# Patient Record
Sex: Male | Born: 1935 | Race: White | Hispanic: No | Smoking: Former smoker
Health system: Southern US, Community
[De-identification: ages and names within clinical notes are randomized; demographics above are authoritative.]

## PROBLEM LIST (undated history)

## (undated) DIAGNOSIS — R17 Unspecified jaundice: Secondary | ICD-10-CM

## (undated) DIAGNOSIS — I219 Acute myocardial infarction, unspecified: Secondary | ICD-10-CM

## (undated) DIAGNOSIS — R059 Cough, unspecified: Secondary | ICD-10-CM

## (undated) DIAGNOSIS — R0989 Other specified symptoms and signs involving the circulatory and respiratory systems: Secondary | ICD-10-CM

## (undated) DIAGNOSIS — K635 Polyp of colon: Secondary | ICD-10-CM

## (undated) DIAGNOSIS — I2510 Atherosclerotic heart disease of native coronary artery without angina pectoris: Secondary | ICD-10-CM

## (undated) DIAGNOSIS — F32A Depression, unspecified: Secondary | ICD-10-CM

## (undated) DIAGNOSIS — I714 Abdominal aortic aneurysm, without rupture, unspecified: Secondary | ICD-10-CM

## (undated) DIAGNOSIS — F329 Major depressive disorder, single episode, unspecified: Secondary | ICD-10-CM

## (undated) DIAGNOSIS — E785 Hyperlipidemia, unspecified: Secondary | ICD-10-CM

## (undated) DIAGNOSIS — R05 Cough: Secondary | ICD-10-CM

## (undated) DIAGNOSIS — M79606 Pain in leg, unspecified: Secondary | ICD-10-CM

## (undated) DIAGNOSIS — I10 Essential (primary) hypertension: Secondary | ICD-10-CM

## (undated) HISTORY — DX: Cough, unspecified: R05.9

## (undated) HISTORY — DX: Pain in leg, unspecified: M79.606

## (undated) HISTORY — DX: Polyp of colon: K63.5

## (undated) HISTORY — DX: Unspecified jaundice: R17

## (undated) HISTORY — DX: Abdominal aortic aneurysm, without rupture, unspecified: I71.40

## (undated) HISTORY — DX: Abdominal aortic aneurysm, without rupture: I71.4

## (undated) HISTORY — DX: Acute myocardial infarction, unspecified: I21.9

## (undated) HISTORY — PX: OTHER SURGICAL HISTORY: SHX169

## (undated) HISTORY — DX: Essential (primary) hypertension: I10

## (undated) HISTORY — DX: Cough: R05

## (undated) HISTORY — DX: Atherosclerotic heart disease of native coronary artery without angina pectoris: I25.10

## (undated) HISTORY — DX: Depression, unspecified: F32.A

## (undated) HISTORY — DX: Other specified symptoms and signs involving the circulatory and respiratory systems: R09.89

## (undated) HISTORY — DX: Hyperlipidemia, unspecified: E78.5

## (undated) HISTORY — DX: Major depressive disorder, single episode, unspecified: F32.9

---

## 2002-05-18 ENCOUNTER — Encounter: Payer: Self-pay | Admitting: Gastroenterology

## 2002-08-27 ENCOUNTER — Inpatient Hospital Stay (HOSPITAL_COMMUNITY): Admission: EM | Admit: 2002-08-27 | Discharge: 2002-08-30 | Payer: Self-pay | Admitting: Emergency Medicine

## 2002-08-27 ENCOUNTER — Encounter: Payer: Self-pay | Admitting: *Deleted

## 2002-08-30 ENCOUNTER — Encounter: Payer: Self-pay | Admitting: Internal Medicine

## 2003-03-07 ENCOUNTER — Emergency Department (HOSPITAL_COMMUNITY): Admission: EM | Admit: 2003-03-07 | Discharge: 2003-03-07 | Payer: Self-pay | Admitting: Emergency Medicine

## 2003-03-07 ENCOUNTER — Encounter: Payer: Self-pay | Admitting: Emergency Medicine

## 2003-05-27 HISTORY — PX: CHOLECYSTECTOMY: SHX55

## 2003-08-01 ENCOUNTER — Encounter: Admission: RE | Admit: 2003-08-01 | Discharge: 2003-08-01 | Payer: Self-pay | Admitting: Cardiology

## 2003-10-04 ENCOUNTER — Ambulatory Visit (HOSPITAL_COMMUNITY): Admission: RE | Admit: 2003-10-04 | Discharge: 2003-10-04 | Payer: Self-pay | Admitting: Internal Medicine

## 2004-01-11 ENCOUNTER — Ambulatory Visit (HOSPITAL_COMMUNITY): Admission: RE | Admit: 2004-01-11 | Discharge: 2004-01-11 | Payer: Self-pay | Admitting: Family Medicine

## 2004-03-05 ENCOUNTER — Inpatient Hospital Stay (HOSPITAL_COMMUNITY): Admission: EM | Admit: 2004-03-05 | Discharge: 2004-03-16 | Payer: Self-pay | Admitting: Emergency Medicine

## 2004-03-09 ENCOUNTER — Encounter: Payer: Self-pay | Admitting: Gastroenterology

## 2004-03-12 ENCOUNTER — Encounter (INDEPENDENT_AMBULATORY_CARE_PROVIDER_SITE_OTHER): Payer: Self-pay | Admitting: Specialist

## 2004-03-28 ENCOUNTER — Ambulatory Visit: Payer: Self-pay | Admitting: Family Medicine

## 2004-04-03 ENCOUNTER — Ambulatory Visit: Payer: Self-pay | Admitting: Family Medicine

## 2004-04-10 ENCOUNTER — Ambulatory Visit: Payer: Self-pay | Admitting: Family Medicine

## 2004-04-16 ENCOUNTER — Ambulatory Visit: Payer: Self-pay | Admitting: Family Medicine

## 2004-04-17 ENCOUNTER — Ambulatory Visit: Payer: Self-pay | Admitting: Family Medicine

## 2004-04-24 ENCOUNTER — Ambulatory Visit: Payer: Self-pay | Admitting: Family Medicine

## 2004-05-01 ENCOUNTER — Ambulatory Visit: Payer: Self-pay | Admitting: Family Medicine

## 2004-05-08 ENCOUNTER — Ambulatory Visit: Payer: Self-pay | Admitting: Cardiology

## 2004-06-01 ENCOUNTER — Ambulatory Visit: Payer: Self-pay | Admitting: Cardiology

## 2004-06-01 ENCOUNTER — Inpatient Hospital Stay (HOSPITAL_COMMUNITY): Admission: EM | Admit: 2004-06-01 | Discharge: 2004-06-05 | Payer: Self-pay | Admitting: Emergency Medicine

## 2004-06-06 ENCOUNTER — Ambulatory Visit: Payer: Self-pay | Admitting: Family Medicine

## 2004-06-13 ENCOUNTER — Ambulatory Visit (HOSPITAL_COMMUNITY): Admission: RE | Admit: 2004-06-13 | Discharge: 2004-06-13 | Payer: Self-pay | Admitting: *Deleted

## 2004-07-02 ENCOUNTER — Ambulatory Visit: Payer: Self-pay | Admitting: Cardiology

## 2004-07-03 ENCOUNTER — Ambulatory Visit: Payer: Self-pay | Admitting: Family Medicine

## 2004-07-08 ENCOUNTER — Ambulatory Visit: Payer: Self-pay | Admitting: Family Medicine

## 2004-08-05 ENCOUNTER — Ambulatory Visit: Payer: Self-pay | Admitting: Family Medicine

## 2004-08-30 ENCOUNTER — Ambulatory Visit: Payer: Self-pay | Admitting: Family Medicine

## 2004-09-06 ENCOUNTER — Ambulatory Visit: Payer: Self-pay | Admitting: Family Medicine

## 2004-09-20 ENCOUNTER — Ambulatory Visit: Payer: Self-pay | Admitting: Family Medicine

## 2004-10-08 ENCOUNTER — Ambulatory Visit: Payer: Self-pay | Admitting: Family Medicine

## 2004-11-06 ENCOUNTER — Ambulatory Visit: Payer: Self-pay | Admitting: Family Medicine

## 2004-11-11 ENCOUNTER — Ambulatory Visit: Payer: Self-pay | Admitting: Cardiology

## 2004-11-14 ENCOUNTER — Ambulatory Visit: Payer: Self-pay | Admitting: Gastroenterology

## 2004-11-19 ENCOUNTER — Ambulatory Visit: Payer: Self-pay

## 2004-12-10 ENCOUNTER — Ambulatory Visit: Payer: Self-pay | Admitting: Gastroenterology

## 2004-12-10 ENCOUNTER — Ambulatory Visit: Payer: Self-pay | Admitting: Family Medicine

## 2005-02-10 ENCOUNTER — Ambulatory Visit: Payer: Self-pay | Admitting: Family Medicine

## 2005-02-14 ENCOUNTER — Ambulatory Visit: Payer: Self-pay | Admitting: Family Medicine

## 2005-02-25 ENCOUNTER — Ambulatory Visit: Payer: Self-pay | Admitting: Family Medicine

## 2005-03-10 ENCOUNTER — Ambulatory Visit: Payer: Self-pay | Admitting: Family Medicine

## 2005-03-14 ENCOUNTER — Ambulatory Visit: Payer: Self-pay | Admitting: Family Medicine

## 2005-04-10 ENCOUNTER — Ambulatory Visit: Payer: Self-pay | Admitting: Family Medicine

## 2005-05-12 ENCOUNTER — Ambulatory Visit: Payer: Self-pay | Admitting: Family Medicine

## 2005-05-22 ENCOUNTER — Ambulatory Visit: Payer: Self-pay | Admitting: Cardiology

## 2005-07-10 ENCOUNTER — Ambulatory Visit: Payer: Self-pay | Admitting: Family Medicine

## 2005-08-15 ENCOUNTER — Ambulatory Visit: Payer: Self-pay | Admitting: Family Medicine

## 2005-08-26 ENCOUNTER — Ambulatory Visit: Payer: Self-pay | Admitting: Family Medicine

## 2005-09-25 ENCOUNTER — Ambulatory Visit: Payer: Self-pay | Admitting: Family Medicine

## 2005-10-15 ENCOUNTER — Ambulatory Visit: Payer: Self-pay | Admitting: Family Medicine

## 2005-11-03 ENCOUNTER — Ambulatory Visit: Payer: Self-pay | Admitting: Family Medicine

## 2005-12-17 ENCOUNTER — Ambulatory Visit: Payer: Self-pay | Admitting: Family Medicine

## 2006-01-06 ENCOUNTER — Ambulatory Visit: Payer: Self-pay

## 2006-01-06 ENCOUNTER — Encounter: Payer: Self-pay | Admitting: Cardiology

## 2006-01-16 ENCOUNTER — Ambulatory Visit: Payer: Self-pay | Admitting: Cardiology

## 2006-01-21 IMAGING — CR DG CHEST 2V
2 series · 2 of 2 positions shown · non-contrast
Comparison: none

CLINICAL DATA: Shortness of breath. 
 CHEST, TWO VIEWS  
 PA and lateral views without comparison films reveal the heart size to be mildly enlarged with dilatation of the aorta.  The markings are accentuated in the lower lung fields with some loss of volume in the bases.  
 IMPRESSION
 The heart size is prominent with chronic changes in the bases.  No definite active findings.  No pulmonary edema is thought to be present.   There are areas of pleural scarring on the right.  Comparison with previous chest x-ray would be suggested.

[view not recorded (1 of 2)]
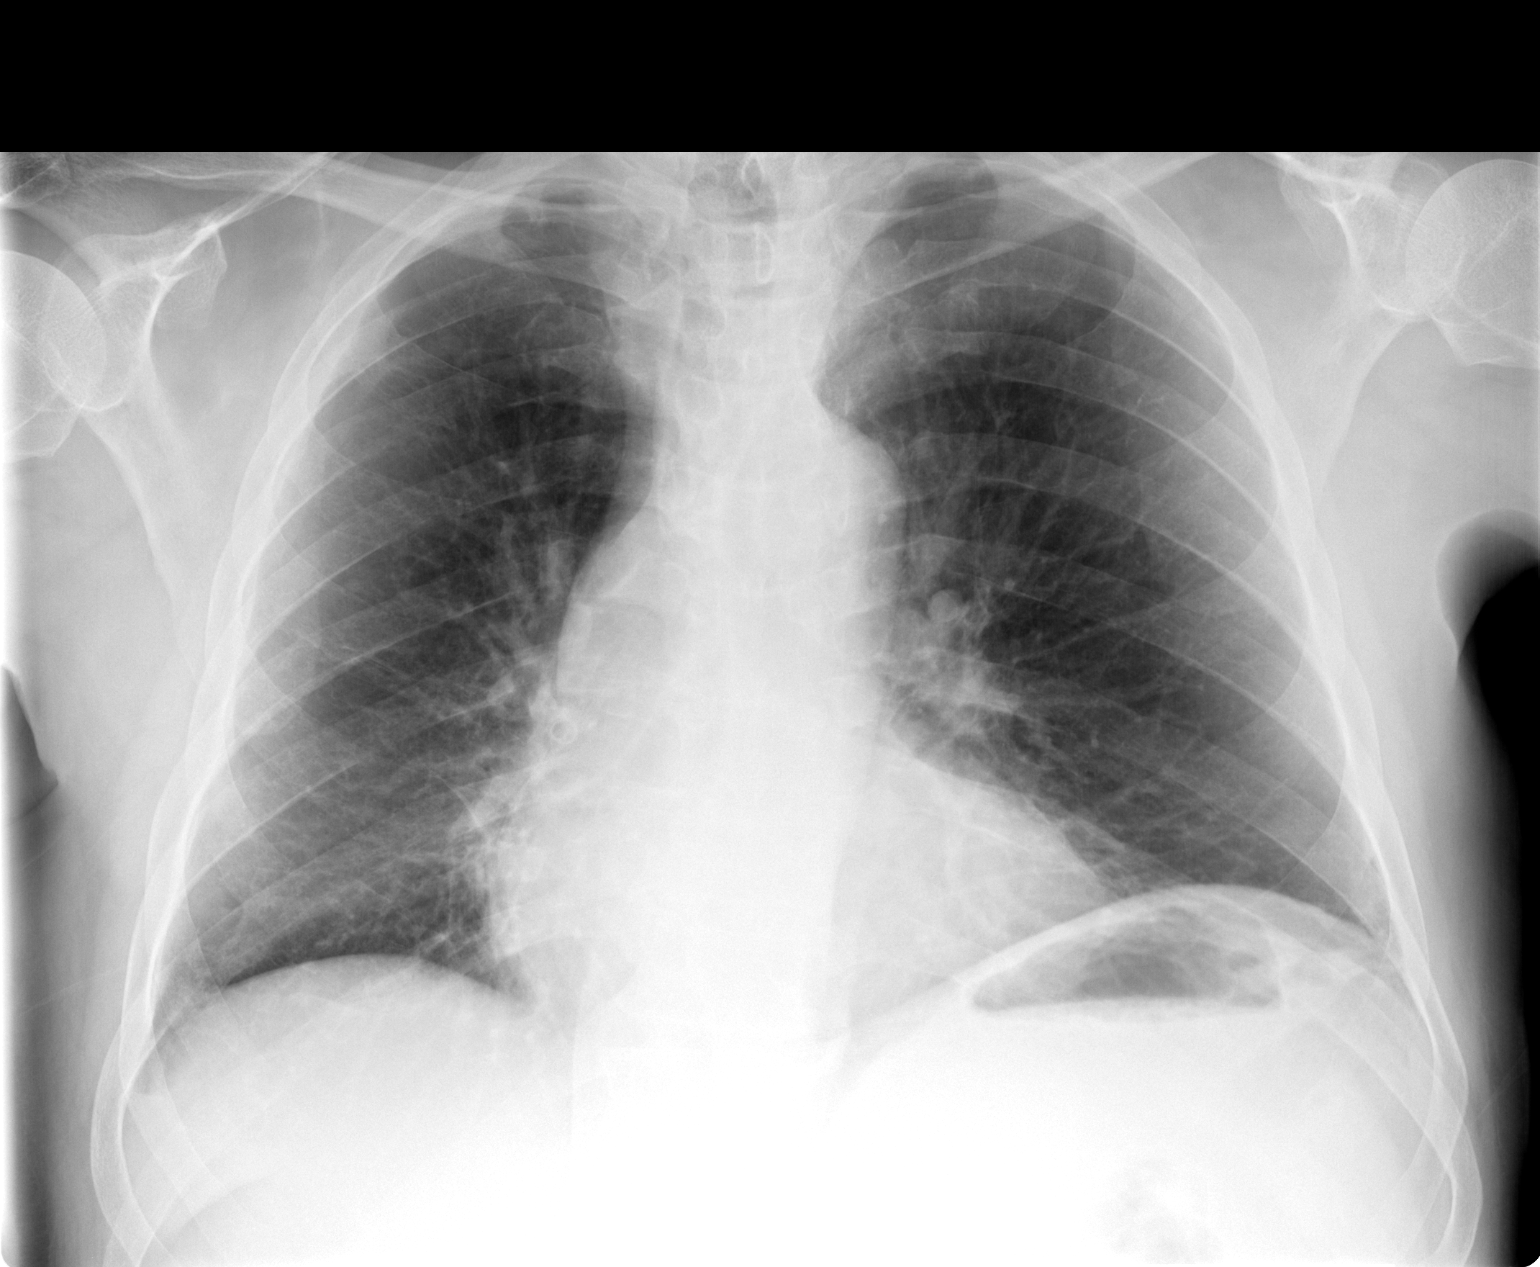

[view not recorded (2 of 2)]
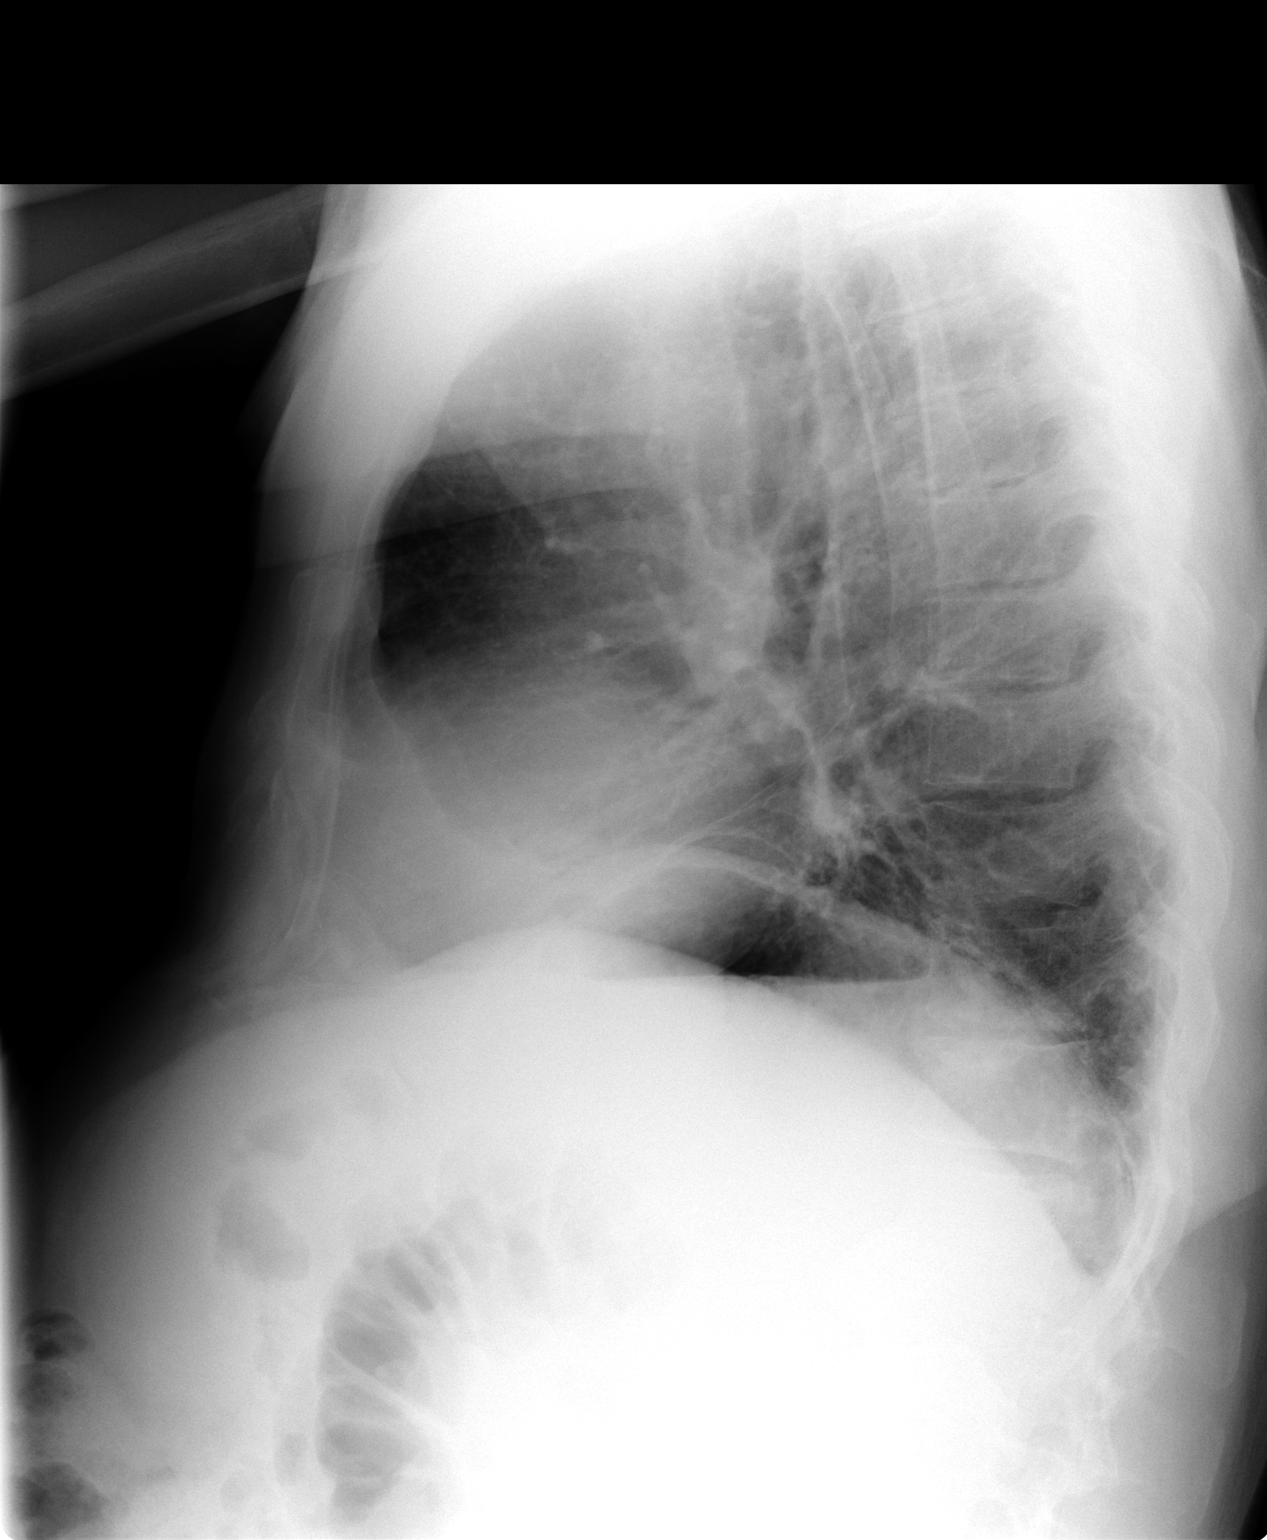

[2 of 2 positions shown; findings below may reference images not displayed]

## 2006-01-28 ENCOUNTER — Ambulatory Visit: Payer: Self-pay | Admitting: Family Medicine

## 2006-02-19 ENCOUNTER — Ambulatory Visit: Payer: Self-pay | Admitting: Family Medicine

## 2006-03-19 ENCOUNTER — Ambulatory Visit: Payer: Self-pay | Admitting: Family Medicine

## 2006-04-20 ENCOUNTER — Ambulatory Visit: Payer: Self-pay | Admitting: Family Medicine

## 2006-05-08 ENCOUNTER — Ambulatory Visit: Payer: Self-pay | Admitting: Cardiology

## 2006-05-20 ENCOUNTER — Ambulatory Visit: Payer: Self-pay | Admitting: Family Medicine

## 2006-06-23 ENCOUNTER — Ambulatory Visit: Payer: Self-pay | Admitting: Family Medicine

## 2006-07-23 ENCOUNTER — Ambulatory Visit: Payer: Self-pay | Admitting: *Deleted

## 2006-07-24 ENCOUNTER — Ambulatory Visit: Payer: Self-pay | Admitting: Family Medicine

## 2006-09-11 ENCOUNTER — Ambulatory Visit: Payer: Self-pay | Admitting: Family Medicine

## 2006-09-22 ENCOUNTER — Ambulatory Visit: Payer: Self-pay | Admitting: Family Medicine

## 2006-09-29 ENCOUNTER — Ambulatory Visit: Payer: Self-pay | Admitting: Cardiology

## 2006-10-21 ENCOUNTER — Ambulatory Visit: Payer: Self-pay | Admitting: Family Medicine

## 2006-11-09 ENCOUNTER — Ambulatory Visit: Payer: Self-pay | Admitting: Cardiology

## 2006-11-12 ENCOUNTER — Ambulatory Visit: Payer: Self-pay | Admitting: Cardiology

## 2006-11-13 ENCOUNTER — Inpatient Hospital Stay (HOSPITAL_COMMUNITY): Admission: EM | Admit: 2006-11-13 | Discharge: 2006-11-13 | Payer: Self-pay | Admitting: *Deleted

## 2006-11-14 ENCOUNTER — Emergency Department (HOSPITAL_COMMUNITY): Admission: EM | Admit: 2006-11-14 | Discharge: 2006-11-14 | Payer: Self-pay | Admitting: Emergency Medicine

## 2006-12-02 ENCOUNTER — Ambulatory Visit: Payer: Self-pay | Admitting: Cardiology

## 2007-01-21 ENCOUNTER — Ambulatory Visit: Payer: Self-pay | Admitting: *Deleted

## 2007-02-04 ENCOUNTER — Ambulatory Visit: Payer: Self-pay | Admitting: *Deleted

## 2007-02-04 ENCOUNTER — Encounter: Admission: RE | Admit: 2007-02-04 | Discharge: 2007-02-04 | Payer: Self-pay | Admitting: *Deleted

## 2007-03-22 ENCOUNTER — Ambulatory Visit: Payer: Self-pay | Admitting: *Deleted

## 2007-03-22 ENCOUNTER — Inpatient Hospital Stay (HOSPITAL_COMMUNITY): Admission: RE | Admit: 2007-03-22 | Discharge: 2007-03-24 | Payer: Self-pay | Admitting: *Deleted

## 2007-03-22 HISTORY — PX: ABDOMINAL AORTIC ANEURYSM REPAIR: SUR1152

## 2007-04-29 ENCOUNTER — Encounter: Admission: RE | Admit: 2007-04-29 | Discharge: 2007-04-29 | Payer: Self-pay | Admitting: *Deleted

## 2007-04-29 ENCOUNTER — Ambulatory Visit: Payer: Self-pay | Admitting: *Deleted

## 2007-05-17 ENCOUNTER — Ambulatory Visit: Payer: Self-pay

## 2007-05-27 DIAGNOSIS — I219 Acute myocardial infarction, unspecified: Secondary | ICD-10-CM

## 2007-05-27 HISTORY — DX: Acute myocardial infarction, unspecified: I21.9

## 2007-06-11 ENCOUNTER — Ambulatory Visit: Payer: Self-pay | Admitting: Cardiology

## 2007-11-11 ENCOUNTER — Ambulatory Visit: Payer: Self-pay | Admitting: *Deleted

## 2008-01-24 ENCOUNTER — Ambulatory Visit: Payer: Self-pay | Admitting: Cardiology

## 2008-05-11 ENCOUNTER — Ambulatory Visit: Payer: Self-pay | Admitting: *Deleted

## 2008-05-11 ENCOUNTER — Encounter: Admission: RE | Admit: 2008-05-11 | Discharge: 2008-05-11 | Payer: Self-pay | Admitting: *Deleted

## 2008-07-03 ENCOUNTER — Ambulatory Visit: Payer: Self-pay | Admitting: Cardiology

## 2008-07-03 LAB — CONVERTED CEMR LAB
ALT: 16 units/L (ref 0–53)
AST: 21 units/L (ref 0–37)
Albumin: 3.8 g/dL (ref 3.5–5.2)
HDL: 27 mg/dL — ABNORMAL LOW (ref >39.0)
Total Bilirubin: 2.4 mg/dL — ABNORMAL HIGH (ref 0.3–1.2)
Total Protein: 7.1 g/dL (ref 6.0–8.3)
Triglycerides: 139 mg/dL (ref 0–149)
VLDL: 28 mg/dL (ref 0–40)

## 2008-10-04 ENCOUNTER — Encounter: Payer: Self-pay | Admitting: Cardiology

## 2008-11-09 ENCOUNTER — Ambulatory Visit: Payer: Self-pay | Admitting: *Deleted

## 2009-01-30 ENCOUNTER — Ambulatory Visit: Payer: Self-pay | Admitting: Cardiology

## 2009-01-30 ENCOUNTER — Telehealth: Payer: Self-pay | Admitting: Cardiology

## 2009-02-01 ENCOUNTER — Encounter: Payer: Self-pay | Admitting: Cardiology

## 2009-04-02 ENCOUNTER — Telehealth: Payer: Self-pay | Admitting: Cardiology

## 2009-04-03 ENCOUNTER — Telehealth: Payer: Self-pay | Admitting: Cardiology

## 2009-04-24 ENCOUNTER — Encounter: Payer: Self-pay | Admitting: Cardiology

## 2009-05-15 ENCOUNTER — Ambulatory Visit: Payer: Self-pay | Admitting: Vascular Surgery

## 2009-05-15 ENCOUNTER — Encounter: Admission: RE | Admit: 2009-05-15 | Discharge: 2009-05-15 | Payer: Self-pay | Admitting: Vascular Surgery

## 2009-09-11 IMAGING — CR DG CHEST 1V PORT
1 series · 1 of 1 positions shown · non-contrast
Comparison: 03/18/07.

CLINICAL DATA: Aortic stent placement. 
 PORTABLE CHEST - 1 VIEW:

[view not recorded]
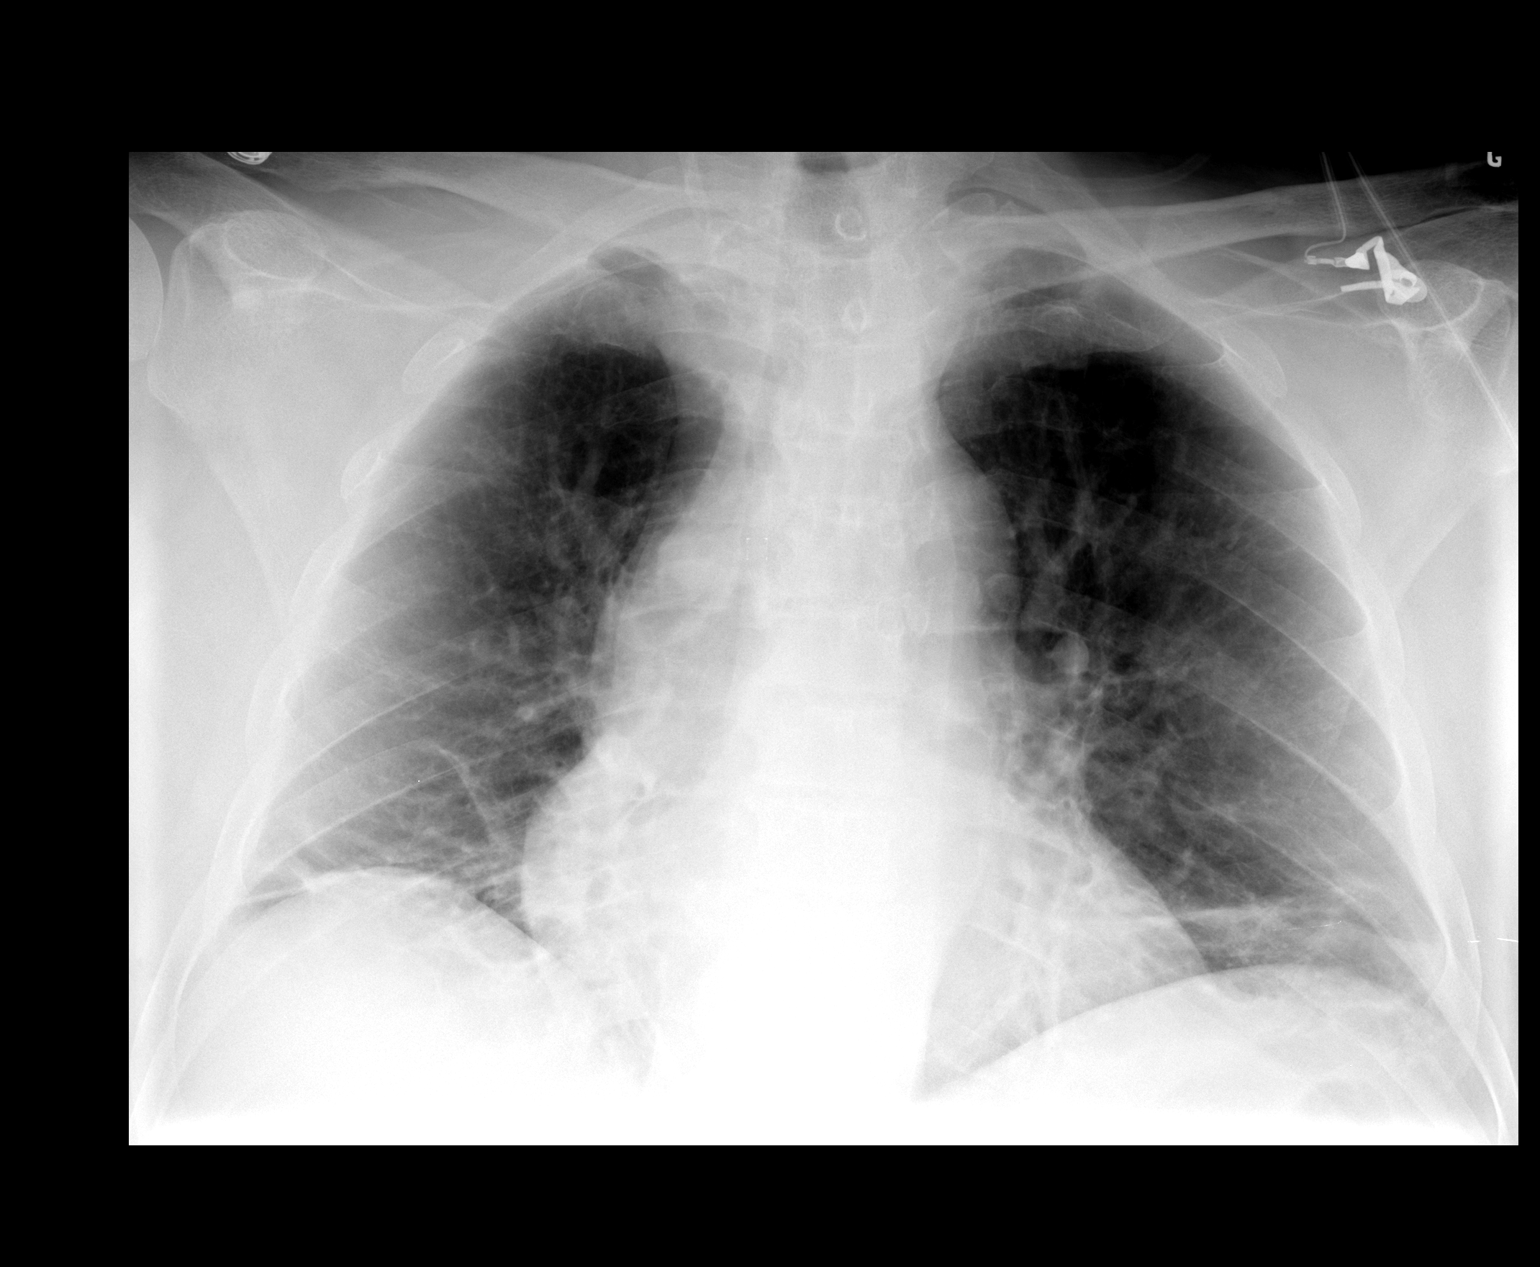

[1 of 1 positions shown; findings below may reference images not displayed]

FINDINGS: Trachea is midline.  Heart size stable.  Lungs are low in volume with bibasilar atelectasis.  Right IJ catheter sheath terminates in the right internal jugular vein.  No pneumothorax.
IMPRESSION: Low lung volumes with bibasilar atelectasis.

## 2009-09-26 ENCOUNTER — Encounter (INDEPENDENT_AMBULATORY_CARE_PROVIDER_SITE_OTHER): Payer: Self-pay | Admitting: *Deleted

## 2009-10-04 ENCOUNTER — Encounter (INDEPENDENT_AMBULATORY_CARE_PROVIDER_SITE_OTHER): Payer: Self-pay | Admitting: *Deleted

## 2009-10-04 ENCOUNTER — Ambulatory Visit: Payer: Self-pay | Admitting: Gastroenterology

## 2009-10-17 ENCOUNTER — Ambulatory Visit: Payer: Self-pay | Admitting: Gastroenterology

## 2009-11-16 ENCOUNTER — Encounter: Payer: Self-pay | Admitting: Cardiology

## 2009-11-21 ENCOUNTER — Ambulatory Visit: Payer: Self-pay | Admitting: Cardiology

## 2010-04-26 ENCOUNTER — Ambulatory Visit: Payer: Self-pay | Admitting: Cardiology

## 2010-04-26 ENCOUNTER — Encounter: Payer: Self-pay | Admitting: Cardiology

## 2010-04-29 ENCOUNTER — Ambulatory Visit (HOSPITAL_COMMUNITY)
Admission: RE | Admit: 2010-04-29 | Discharge: 2010-04-29 | Payer: Self-pay | Source: Home / Self Care | Admitting: Cardiology

## 2010-05-21 ENCOUNTER — Ambulatory Visit: Payer: Self-pay | Admitting: Vascular Surgery

## 2010-06-15 ENCOUNTER — Encounter: Payer: Self-pay | Admitting: Family Medicine

## 2010-06-15 ENCOUNTER — Encounter: Payer: Self-pay | Admitting: *Deleted

## 2010-06-27 NOTE — Assessment & Plan Note (Signed)
Summary: f32m  Medications Added SIMVASTATIN 80 MG TABS (SIMVASTATIN) Take one-half tablet by mouth daily at bedtime PLAVIX 75 MG TABS (CLOPIDOGREL BISULFATE) Take one tablet by mouth daily ALPRAZOLAM 0.25 MG TABS (ALPRAZOLAM) take one tab by mouth once daily as needed      Allergies Added: NKDA  Visit Type:  6 mo f/u Primary Provider:  Joette Catching, MD  CC:  sob at times...denies any cp or edema or leg pain.  History of Present Illness: The patient is 77 years old and return for followup management of CAD. He has 4 drug alluding stents in the rightt coronary artery and one drug-eluting stent in the LAD. In January 2008 he had his last catheterization which showed nonobstructive disease.   He's done well with no recent chest pain shortness of breath or palpitations. He does complain of a chronic cough which is nonproductive which has been going on for at least several months. He also has had some trouble sleeping at night although there is no particular symptom that wakes him up.   His other medical problems include hypertension, hyperlipidemia, carotid bruit, and a previous skin graft for an abdominal aneurysm.  His wife who is a patient of mine died 09/02/2009. He previously had lost a son to a sudden death one year ago. He is now living alone. He does have 2 grandchildren in [place] by his son. Portion he has a number of friends in [place] and his wife sister lives in [place].  Current Medications (verified): 1)  Simvastatin 80 Mg Tabs (Simvastatin) .... Take One-Half Tablet By Mouth Daily At Bedtime 2)  Metoprolol Succinate 25 Mg Xr24h-Tab (Metoprolol Succinate) .... Take 1/2 Tablet By Mouth Daily 3)  Aspirin 81 Mg Tbec (Aspirin) .... Take One Tablet By Mouth Daily. 4)  Plavix 75 Mg Tabs (Clopidogrel Bisulfate) .... Take One Tablet By Mouth Daily 5)  Amlodipine Besylate 5 Mg Tabs (Amlodipine Besylate) .... Take One Tablet By Mouth Daily 6)  Nitroglycerin 0.4 Mg Subl  (Nitroglycerin) .... One Tablet Under Tongue Every 5 Minutes As Needed For Chest Pain---May Repeat Times Three 7)  Xanax 0.25 Mg Tabs (Alprazolam) .... Take As Needed  Allergies (verified): No Known Drug Allergies  Past History:  Past Medical History: Reviewed history from 01/29/2009 and no changes required.   Coronary heart disease S/P 4 DES RCA, 1 DES LAD with non-obstructive CAD at cath 05/2006 . Status post abdominal stent graft for abdominal aneurysm.   Colon polyps.   Hypertension.  . Hyperlipidemia.  . Carotid bruits without major lesions by Doppler.   Review of Systems       ROS is negative except as outlined in HPI.   Vital Signs:  Patient profile:   77 year old male Height:      73 inches Weight:      215 pounds BMI:     28.47 Pulse rate:   50 / minute Pulse rhythm:   regular BP sitting:   108 / 74  (left arm) Cuff size:   large  Vitals Entered By: Danielle Rankin, CMA (April 26, 2010 11:42 AM)  Physical Exam  Additional Exam:  Gen. Well-nourished, in no distress   Neck: No JVD, thyroid not enlarged, no carotid bruits Lungs: No tachypnea, clear without rales, rhonchi or wheezes Cardiovascular: Rhythm regular, PMI not displaced,  heart sounds  normal, no murmurs or gallops, no peripheral edema, pulses normal in all 4 extremities. Abdomen: BS normal, abdomen soft and non-tender without masses  or organomegaly, no hepatosplenomegaly. MS: No deformities, no cyanosis or clubbing   Neuro:  No focal sns   Skin:  no lesions    Impression & Recommendations:  Problem # 1:  CAD, NATIVE VESSEL (ICD-414.01) He has had multiple stents as described in the history of present illness. His last catheter was in 2008 at which time he had nonobstructive CAD. He's had no chest pain and this problem is stable. His updated medication list for this problem includes:    Metoprolol Succinate 25 Mg Xr24h-tab (Metoprolol succinate) .Marland Kitchen... Take 1/2 tablet by mouth daily    Aspirin 81 Mg  Tbec (Aspirin) .Marland Kitchen... Take one tablet by mouth daily.    Plavix 75 Mg Tabs (Clopidogrel bisulfate) .Marland Kitchen... Take one tablet by mouth daily    Amlodipine Besylate 5 Mg Tabs (Amlodipine besylate) .Marland Kitchen... Take one tablet by mouth daily    Nitroglycerin 0.4 Mg Subl (Nitroglycerin) ..... One tablet under tongue every 5 minutes as needed for chest pain---may repeat times three  His updated medication list for this problem includes:    Metoprolol Succinate 25 Mg Xr24h-tab (Metoprolol succinate) .Marland Kitchen... Take 1/2 tablet by mouth daily    Aspirin 81 Mg Tbec (Aspirin) .Marland Kitchen... Take one tablet by mouth daily.    Plavix 75 Mg Tabs (Clopidogrel bisulfate) .Marland Kitchen... Take one tablet by mouth daily    Amlodipine Besylate 5 Mg Tabs (Amlodipine besylate) .Marland Kitchen... Take one tablet by mouth daily    Nitroglycerin 0.4 Mg Subl (Nitroglycerin) ..... One tablet under tongue every 5 minutes as needed for chest pain---may repeat times three  Orders: EKG w/ Interpretation (93000)  Problem # 2:  HYPERTENSION, BENIGN (ICD-401.1) His blood pressure is well-controlled on current medications.  His updated medication list for this problem includes:    Metoprolol Succinate 25 Mg Xr24h-tab (Metoprolol succinate) .Marland Kitchen... Take 1/2 tablet by mouth daily    Aspirin 81 Mg Tbec (Aspirin) .Marland Kitchen... Take one tablet by mouth daily.    Amlodipine Besylate 5 Mg Tabs (Amlodipine besylate) .Marland Kitchen... Take one tablet by mouth daily  Problem # 3:  HYPERLIPIDEMIA-MIXED (ICD-272.4) This is currently managed with simvastatin. His updated medication list for this problem includes:    Simvastatin 80 Mg Tabs (Simvastatin) .Marland Kitchen... Take one-half tablet by mouth daily at bedtime  Other Orders: T-2 View CXR (71020TC)  Patient Instructions: 1)  Start Xanax (alprazolam) 0.25mg  one tab by mouth once daily as needed. 2)  A chest x-ray takes a picture of the organs and structures inside the chest, including the heart, lungs, and blood vessels. This test can show several things,  including, whether the heart is enlarged; whether fluid is building up in the lungs; and whether pacemaker / defibrillator leads are still in place. 3)  Your physician wants you to follow-up in: 6 months with Dr. Andee Lineman in our [place] office. You will receive a reminder letter in the mail two months in advance. If you don't receive a letter, please call our office to schedule the follow-up appointment. Prescriptions: ALPRAZOLAM 0.25 MG TABS (ALPRAZOLAM) take one tab by mouth once daily as needed  #30 x 1   Entered by:   Sherri Rad, RN, BSN   Authorized by:   Lenoria Farrier, MD, [facility]   Signed by:   Sherri Rad, RN, BSN on 04/26/2010   Method used:   Print then Give to Patient   RxID:   [id number] PLAVIX 75 MG TABS (CLOPIDOGREL BISULFATE) Take one tablet by mouth daily  #30 x 11  Entered by:   Danielle Rankin, CMA   Authorized by:   Lenoria Farrier, MD, [facility]   Signed by:   Danielle Rankin, CMA on 04/26/2010   Method used:   Print then Give to Patient   RxID:   [id number]

## 2010-06-27 NOTE — Procedures (Signed)
Summary: Colonoscopy  Patient: [name] Note: All result statuses are Final unless otherwise noted.  Tests: (1) Colonoscopy (COL)   COL Colonoscopy           DONE     [facility]     [address]     [place], [place]  [address]           COLONOSCOPY PROCEDURE REPORT           PATIENT:  Alex, Escobar  MR#:  [id number]     BIRTHDATE:  1927-12-09, 81 yrs. old  GENDER:  male     ENDOSCOPIST:  Vania Rea. Jarold Motto, MD, [facility]     REF. BY:     PROCEDURE DATE:  10/17/2009     PROCEDURE:  Colonoscopy with snare polypectomy     ASA CLASS:  Class III     INDICATIONS:  colorectal cancer screening, average risk     MEDICATIONS:   Fentanyl 50 mcg IV, Versed 7 mg IV           DESCRIPTION OF PROCEDURE:   After the risks benefits and     alternatives of the procedure were thoroughly explained, informed     consent was obtained.  Digital rectal exam was performed and     revealed no abnormalities.   The  endoscope was introduced through     the anus and advanced to the cecum, which was identified by both     the appendix and ileocecal valve, limited by a redundant colon,     poor preparation.    The quality of the prep was poor, using     MoviPrep.  The instrument was then slowly withdrawn as the colon     was fully examined.     <<PROCEDUREIMAGES>>           FINDINGS:  ENDOSCOPIC FINDINGS:   A sessile polyp was found.  CM     DESCENDING COLON POLYP HOT SNARE EXCISED.TOO POOR A PREP TO     RETRIEVE.!!!! Severe diverticulosis was found throughout the     colon.           ULTRASONIC FINDINGS:   A sessile polyp was found.  cm flat cecal     polyp hot snare excised.TOO POOR A PREP TO RETRIEVE.!!!!     Retroflexed views in the rectum revealed no abnormalities.    The     scope was then withdrawn from the patient and the procedure     completed.           COMPLICATIONS:  None     ENDOSCOPIC IMPRESSION:     1) Severe diverticulosis throughout the colon     2) Sessile polyp  3) Sessile polyp     VERY LIMITED EXAM PER INADEQUATE PREP.     RECOMMENDATIONS:     HOLD PLAVIX FOR 5 MORE DAYS.PRN FOLLOWUP OMLY AS NEEDED.     REPEAT EXAM:  No           ______________________________     Vania Rea. Jarold Motto, MD, [facility]           CC:  Joette Catching, MDBruce Phineas Douglas, MD           n.     Rosalie DoctorMarland Kitchen   Vania Rea. Patterson at 10/17/2009 09:15 AM           Barbra Sarks, [id number]  Note: An exclamation mark (!) indicates a result that was not dispersed into the  flowsheet. Document Creation Date: 10/17/2009 9:16 AM _______________________________________________________________________  (1) Order result status: Final Collection or observation date-time: 10/17/2009 09:06 Requested date-time:  Receipt date-time:  Reported date-time:  Referring Physician:   Ordering Physician: Sheryn Bison [phone]) Specimen Source:  Source: Launa Grill Order Number: [id number] Lab site:

## 2010-06-27 NOTE — Letter (Signed)
Summary: [facility] Instructions  [facility]  [address] [place], [place] [address]   Phone: [phone]  Fax: [phone]       [name]    01-26-1928    MRN: [id number]        Procedure Day Dorna Bloom: Wednesday, 10/17/09     Arrival Time: 7:30      Procedure Time: 8:30     Location of Procedure:                    _X _  [facility] (4th Floor)                        PREPARATION FOR COLONOSCOPY WITH MOVIPREP   Starting 5 days prior to your procedure 10/12/09 do not eat nuts, seeds, popcorn, corn, beans, peas,  salads, or any raw vegetables.  Do not take any fiber supplements (e.g. Metamucil, Citrucel, and Benefiber).  THE DAY BEFORE YOUR PROCEDURE         DATE: 10/16/09    DAY: Tuesday  1.  Drink clear liquids the entire day-NO SOLID FOOD  2.  Do not drink anything colored red or purple.  Avoid juices with pulp.  No orange juice.  3.  Drink at least 64 oz. (8 glasses) of fluid/clear liquids during the day to prevent dehydration and help the prep work efficiently.  CLEAR LIQUIDS INCLUDE: Water Jello Ice Popsicles Tea (sugar ok, no milk/cream) Powdered fruit flavored drinks Coffee (sugar ok, no milk/cream) Gatorade Juice: apple, white grape, white cranberry  Lemonade Clear bullion, consomm, broth Carbonated beverages (any kind) Strained chicken noodle soup Hard Candy                             4.  In the morning, mix first dose of MoviPrep solution:    Empty 1 Pouch A and 1 Pouch B into the disposable container    Add lukewarm drinking water to the top line of the container. Mix to dissolve    Refrigerate (mixed solution should be used within 24 hrs)  5.  Begin drinking the prep at 5:00 p.m. The MoviPrep container is divided by 4 marks.   Every 15 minutes drink the solution down to the next mark (approximately 8 oz) until the full liter is complete.   6.  Follow completed prep with 16 oz of clear liquid of your choice (Nothing  red or purple).  Continue to drink clear liquids until bedtime.  7.  Before going to bed, mix second dose of MoviPrep solution:    Empty 1 Pouch A and 1 Pouch B into the disposable container    Add lukewarm drinking water to the top line of the container. Mix to dissolve    Refrigerate  THE DAY OF YOUR PROCEDURE      DATE: 10/17/09   DAY: Wednesday  Beginning at 3:30 a.m. (5 hours before procedure):         1. Every 15 minutes, drink the solution down to the next mark (approx 8 oz) until the full liter is complete.  2. Follow completed prep with 16 oz. of clear liquid of your choice.    3. You may drink clear liquids until 6:30  (2 HOURS BEFORE PROCEDURE).   MEDICATION INSTRUCTIONS  Unless otherwise instructed, you should take regular prescription medications with a small sip of water   as early as possible  the morning of your procedure.    Stop taking Plavix 10/12/09  (5 days before procedure).                OTHER INSTRUCTIONS  You will need a responsible adult at least 75 years of age to accompany you and drive you home.   This person must remain in the waiting room during your procedure.  Wear loose fitting clothing that is easily removed.  Leave jewelry and other valuables at home.  However, you may wish to bring a book to read or  an iPod/MP3 player to listen to music as you wait for your procedure to start.  Remove all body piercing jewelry and leave at home.  Total time from sign-in until discharge is approximately 2-3 hours.  You should go home directly after your procedure and rest.  You can resume normal activities the  day after your procedure.  The day of your procedure you should not:   Drive   Make legal decisions   Operate machinery   Drink alcohol   Return to work  You will receive specific instructions about eating, activities and medications before you leave.    The above instructions have been reviewed and explained to me by    _______________________    I fully understand and can verbalize these instructions _____________________________ Date _________

## 2010-06-27 NOTE — Assessment & Plan Note (Signed)
Summary: discuss colon on BT--ch.    History of Present Illness Visit Type: Initial Visit Primary GI MD: Sheryn Bison MD FACP FAGA Primary Provider: Joette Catching, MD Chief Complaint: Discuss Colonoscopy/on Plavix History of Present Illness:   77 year old Caucasian male referred by Dr. Lysbeth Galas for followup colonoscopy per multiple colon polyps removed 8 years ago. The patient currently is asymptomatic in terms of any GI complaints. He has regular bowel movements without melena or hematochezia. He denies abdominal pain, reflux symptoms, dysphagia, or any hepatobiliary complaints. He is status post laproscopic cholecystectomy because of symptomatic cholelithiasis.  He has had atherosclerosis and peripheral vascular disease which has required percutaneous stenting by Dr. [name]. He also has coronary artery disease as had several stents placed, suffers from hypertension and hyperlipidemia. He recently saw [name] in cardiology and he has approved stopping Plavix for 5 days before his colonoscopy procedure. Chart review shows a diagnosis of fatty liver. The patient gives no history of previous hepatitis or pancreatitis.He Has a Chronic Sleep Disorder which is related to recent depression related to the death of his wife and son. He denies a current cardiovascular or pulmonary complaints.     GI Review of Systems    Reports chest pain.      Denies abdominal pain, acid reflux, belching, bloating, dysphagia with liquids, dysphagia with solids, heartburn, loss of appetite, nausea, vomiting, vomiting blood, weight loss, and  weight gain.      Reports liver problems.     Denies anal fissure, black tarry stools, change in bowel habit, constipation, diarrhea, diverticulosis, fecal incontinence, heme positive stool, hemorrhoids, irritable bowel syndrome, jaundice, light color stool, rectal bleeding, and  rectal pain.    Current Medications (verified): 1)  Ambien Cr 12.5 Mg Cr-Tabs (Zolpidem  Tartrate) .... One Tab By Mouth At Bedtime As Needed For Sleep 2)  Simvastatin 80 Mg Tabs (Simvastatin) .... Take One-Half Tablet By Mouth Daily At Bedtime 3)  Metoprolol Succinate 25 Mg Xr24h-Tab (Metoprolol Succinate) .... Take 1/2 Tablet By Mouth Daily 4)  Aspirin 81 Mg Tbec (Aspirin) .... Take One Tablet By Mouth Daily. 5)  Plavix 75 Mg Tabs (Clopidogrel Bisulfate) .... Take One Tablet By Mouth Daily 6)  Amlodipine Besylate 5 Mg Tabs (Amlodipine Besylate) .... Take One Tablet By Mouth Daily 7)  Nitroglycerin 0.4 Mg Subl (Nitroglycerin) .... One Tablet Under Tongue Every 5 Minutes As Needed For Chest Pain---May Repeat Times Three 8)  Zolpidem Tartrate 10 Mg Tabs (Zolpidem Tartrate) .Marland Kitchen.. 1 By Mouth At Bedtime 9)  Xanax 0.25 Mg Tabs (Alprazolam) .... Take As Needed  Allergies (verified): No Known Drug Allergies  Past History:  Past medical, surgical, family and social histories (including risk factors) reviewed for relevance to current acute and chronic problems.  Past Medical History: Reviewed history from 01/29/2009 and no changes required.   Coronary heart disease S/P 4 DES RCA, 1 DES LAD with non-obstructive CAD at cath 05/2006 . Status post abdominal stent graft for abdominal aneurysm.   Colon polyps.   Hypertension.  . Hyperlipidemia.  . Carotid bruits without major lesions by Doppler.   Past Surgical History: Angioplasty  Family History: Reviewed history from 01/26/2009 and no changes required.  Mother had coronary artery disease, congestive heart   failure, died in her 75s.  Father died in his mid-60s, due to   complications of stroke.  He has a twin sister with coronary artery   disease.  Liver Disease-sister   Social History: Reviewed history from 01/26/2009 and no changes  required. The patient discontinued tobacco use approximately 20   years ago.  He was never a heavy tobacco user.  No alcohol use.  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   He is  married. (now widowed)   Daily Caffeine Use Illicit Drug Use - no Drug Use:  no  Review of Systems General:  Denies fever, chills, sweats, anorexia, fatigue, weakness, malaise, weight loss, and sleep disorder. ENT:  Denies earache, ear discharge, tinnitus, decreased hearing, nasal congestion, loss of smell, nosebleeds, sore throat, hoarseness, and difficulty swallowing. CV:  Denies chest pains, angina, palpitations, syncope, dyspnea on exertion, orthopnea, PND, peripheral edema, and claudication; He relates excellent exercise tolerance without shortness of breath or dyspnea on exertion. He does not have any symptoms of angina.. Resp:  Denies dyspnea at rest, dyspnea with exercise, cough, sputum, wheezing, coughing up blood, and pleurisy; He Has not smoked in over 20 years.Marland Kitchen GI:  Denies difficulty swallowing, pain on swallowing, nausea, indigestion/heartburn, vomiting, vomiting blood, abdominal pain, jaundice, gas/bloating, diarrhea, constipation, change in bowel habits, bloody BM's, black BMs, and fecal incontinence. GU:  Denies urinary burning, blood in urine, urinary frequency, urinary hesitancy, nocturnal urination, urinary incontinence, penile discharge, genital sores, decreased libido, and erectile dysfunction. MS:  Denies joint pain / LOM, joint swelling, joint stiffness, joint deformity, low back pain, muscle weakness, muscle cramps, muscle atrophy, leg pain at night, leg pain with exertion, and shoulder pain / LOM hand / wrist pain (CTS). Neuro:  Denies weakness, paralysis, abnormal sensation, seizures, syncope, tremors, vertigo, transient blindness, frequent falls, frequent headaches, difficulty walking, headache, sciatica, radiculopathy other:, restless legs, memory loss, and confusion. Psych:  Complains of depression and anxiety; He currently is using Ambien and Xanax for anxiety and depression which is situational in nature. He denies any psychotic symptomatology.. Endo:  Denies cold  intolerance, heat intolerance, polydipsia, polyphagia, polyuria, unusual weight change, and hirsutism. Heme:  Denies bruising, bleeding, enlarged lymph nodes, and pagophagia. Allergy:  Denies hives, rash, sneezing, hay fever, and recurrent infections.  Vital Signs:  Patient profile:   77 year old male Height:      73 inches Weight:      213 pounds BMI:     28.20 Pulse rate:   60 / minute Pulse rhythm:   regular BP sitting:   118 / 68  (left arm)  Vitals Entered By: Milford Cage NCMA (Oct 04, 2009 9:38 AM)  Physical Exam  General:  Well developed, well nourished, no acute distress.healthy appearing.  Slight left lid lag noted. Head:  Normocephalic and atraumatic. Eyes:  PERRLA, no icterus.exam deferred to patient's ophthalmologist.   Neck:  Supple; no masses or thyromegaly. Lungs:  Clear throughout to auscultation. Heart:  Regular rate and rhythm; no murmurs, rubs,  or bruits. Abdomen:  Soft, nontender and nondistended. No masses, hepatosplenomegaly or hernias noted. Normal bowel sounds. Rectal:  Normal exam.hemocult negative.   Prostate:  Symmetrically enlarged slightly firm but nonnodular prostate noted. Msk:  Symmetrical with no gross deformities. Normal posture. Pulses:  Normal pulses noted.Good pedal pulses noted. Extremities:  No clubbing, cyanosis, edema or deformities noted. Neurologic:  Alert and  oriented x4;  grossly normal neurologically. Skin:  Intact without significant lesions or rashes.petechiae.  superficial scattered ecchymoses noted. Cervical Nodes:  No significant cervical adenopathy. Psych:  Alert and cooperative. Normal mood and affect.   Impression & Recommendations:  Problem # 1:  PERSONAL HX COLONIC POLYPS (ICD-V12.72) Assessment Unchanged He had greater than 20  polyps removed on last colonoscopy, and has not responded to letter for recall procedures. He is at high risk for recurrent polyposis, and I have scheduled colonoscopy ASAP and we'll hold his  Plavix 5 days before the procedure well continuous salicylates. The risk and benefits of this procedure and polypectomy have been explained in detail, and he is agreed to proceed as planned. He does not have atrial fibrillation, or history of embolization. His heart rate is well controlled his hypertension also well controlled/ blood pressure today 118/68.  Problem # 2:  HYPERTENSION, BENIGN (ICD-401.1) Assessment: Improved Continue multiple cardiac medications as listed and reviewed his records per Dr. Lysbeth Galas in Dr.Brodie.  Problem # 3:  CAD, NATIVE VESSEL (ICD-414.01) Assessment: Comment Only  Problem # 4:  DEPRESSION (ICD-311) Assessment: Deteriorated Consider low-dose SSRI therapy per primary care.  Patient Instructions: 1)  Please continue current medications.  2)  You are scheduled for a colonoscopy. 3)  The medication list was reviewed and reconciled.  All changed / newly prescribed medications were explained.  A complete medication list was provided to the patient / caregiver. 4)  Copy sent to : Dr. Joette Catching and [name]. 5)  Please continue current medications.  6)  Colonoscopy and Flexible Sigmoidoscopy brochure given.  7)  Conscious Sedation brochure given.  8)  Hold Plavix 5 days before colonoscopy but continue salicylates.  Appended Document: discuss colon on BT--ch.    Clinical Lists Changes  Medications: Added new medication of MOVIPREP 100 GM  SOLR (PEG-KCL-NACL-NASULF-NA ASC-C) As per prep instructions. - Signed Rx of MOVIPREP 100 GM  SOLR (PEG-KCL-NACL-NASULF-NA ASC-C) As per prep instructions.;  #1 x 0;  Signed;  Entered by: Ashok Cordia RN;  Authorized by: Mardella Layman MD [facility];  Method used: Print then Give to Patient Orders: Added new Test order of Colonoscopy (Colon) - Signed    Prescriptions: MOVIPREP 100 GM  SOLR (PEG-KCL-NACL-NASULF-NA ASC-C) As per prep instructions.  #1 x 0   Entered by:   Ashok Cordia RN   Authorized by:   Mardella Layman MD [facility]   Signed by:   Ashok Cordia RN on 10/04/2009   Method used:   Print then Give to Patient   RxID:   [id number]    Appended Document: discuss colon on BT--ch.    Clinical Lists Changes

## 2010-06-27 NOTE — Assessment & Plan Note (Signed)
Summary: M5H      Allergies Added: NKDA  Visit Type:  Follow-up Primary Provider:  Joette Catching, MD  CC:  Pt bruising easily.  History of Present Illness: The patient is 77 years old and return for followup management of CAD. He has 4 drug alluding stents in the rightt coronary artery and one drug-eluting stent in the LAD. In January 2008 he had his last catheterization which showed nonobstructive disease. He's done well with no recent chest pain shortness of breath or palpitations.  He has had some easy bruising. His laboratory studies at his primary care physician's office showed a platelet count of 123,000.  His other medical problems include hypertension, hyperlipidemia, carotid bruit, and a previous skin graft for an abdominal aneurysm.  His wife who is a patient of mine died 11-Sep-2009. He previously had lost a son to a sudden death one year ago. He is now living alone. He does have 2 grandchildren in [place] by his son. Portion he has a number of friends in [place] and his wife sister lives in [place].  Current Medications (verified): 1)  Simvastatin 80 Mg Tabs (Simvastatin) .... Take One-Half Tablet By Mouth Daily At Bedtime 2)  Metoprolol Succinate 25 Mg Xr24h-Tab (Metoprolol Succinate) .... Take 1/2 Tablet By Mouth Daily 3)  Aspirin 81 Mg Tbec (Aspirin) .... Take One Tablet By Mouth Daily. 4)  Plavix 75 Mg Tabs (Clopidogrel Bisulfate) .... Take One Tablet By Mouth Daily 5)  Amlodipine Besylate 5 Mg Tabs (Amlodipine Besylate) .... Take One Tablet By Mouth Daily 6)  Nitroglycerin 0.4 Mg Subl (Nitroglycerin) .... One Tablet Under Tongue Every 5 Minutes As Needed For Chest Pain---May Repeat Times Three 7)  Zolpidem Tartrate 10 Mg Tabs (Zolpidem Tartrate) .Marland Kitchen.. 1 By Mouth At Bedtime 8)  Xanax 0.25 Mg Tabs (Alprazolam) .... Take As Needed  Allergies (verified): No Known Drug Allergies  Past History:  Past Medical History: Reviewed history from 01/29/2009 and no changes  required.   Coronary heart disease S/P 4 DES RCA, 1 DES LAD with non-obstructive CAD at cath 05/2006 . Status post abdominal stent graft for abdominal aneurysm.   Colon polyps.   Hypertension.  . Hyperlipidemia.  . Carotid bruits without major lesions by Doppler.   Review of Systems       ROS is negative except as outlined in HPI.   Vital Signs:  Patient profile:   77 year old male Height:      73 inches Weight:      213 pounds BMI:     28.20 Pulse rate:   56 / minute BP sitting:   115 / 65  (left arm)  Vitals Entered By: Burnett Kanaris, CNA (November 21, 2009 11:03 AM)  Physical Exam  Additional Exam:  Gen. Well-nourished, in no distress   Neck: No JVD, thyroid not enlarged, no carotid bruits Lungs: No tachypnea, clear without rales, rhonchi or wheezes Cardiovascular: Rhythm regular, PMI not displaced,  heart sounds  normal, no murmurs or gallops, no peripheral edema, pulses normal in all 4 extremities. Abdomen: BS normal, abdomen soft and non-tender without masses or organomegaly, no hepatosplenomegaly. MS: No deformities, no cyanosis or clubbing   Neuro:  No focal sns   Skin:  no lesions    Impression & Recommendations:  Problem # 1:  CAD, NATIVE VESSEL (ICD-414.01)  he has 4r DES in the RCA and one DES in the LAD. His had no chest pain and appears stable. His platelet count is 123,000. He  is on Plavix and aspirin and is having some easy bruising. We discussed whether to continue his Plavix or not. I think with 4 drug-eluting stents in the RCA am concerned about his risk of very late stent thrombosis. He is agreeable to staying on the Plavix and can tolerate the bruising. He did have a nosebleed here in the office today and if this becomes a recurrent problem and we may need to reconsider.   His updated medication list for this problem includes:    Metoprolol Succinate 25 Mg Xr24h-tab (Metoprolol succinate) .Marland Kitchen... Take 1/2 tablet by mouth daily    Aspirin 81 Mg Tbec (Aspirin)  .Marland Kitchen... Take one tablet by mouth daily.    Plavix 75 Mg Tabs (Clopidogrel bisulfate) .Marland Kitchen... Take one tablet by mouth daily    Amlodipine Besylate 5 Mg Tabs (Amlodipine besylate) .Marland Kitchen... Take one tablet by mouth daily    Nitroglycerin 0.4 Mg Subl (Nitroglycerin) ..... One tablet under tongue every 5 minutes as needed for chest pain---may repeat times three  Orders: EKG w/ Interpretation (93000)  Problem # 2:  HYPERTENSION, BENIGN (ICD-401.1)  This appears well controlled on current therapy. His updated medication list for this problem includes:    Metoprolol Succinate 25 Mg Xr24h-tab (Metoprolol succinate) .Marland Kitchen... Take 1/2 tablet by mouth daily    Aspirin 81 Mg Tbec (Aspirin) .Marland Kitchen... Take one tablet by mouth daily.    Amlodipine Besylate 5 Mg Tabs (Amlodipine besylate) .Marland Kitchen... Take one tablet by mouth daily  His updated medication list for this problem includes:    Metoprolol Succinate 25 Mg Xr24h-tab (Metoprolol succinate) .Marland Kitchen... Take 1/2 tablet by mouth daily    Aspirin 81 Mg Tbec (Aspirin) .Marland Kitchen... Take one tablet by mouth daily.    Amlodipine Besylate 5 Mg Tabs (Amlodipine besylate) .Marland Kitchen... Take one tablet by mouth daily  Problem # 3:  HYPERLIPIDEMIA-MIXED (ICD-272.4) habitus is currently managed with 40 mg of simvastatin. His updated medication list for this problem includes:    Simvastatin 80 Mg Tabs (Simvastatin) .Marland Kitchen... Take one-half tablet by mouth daily at bedtime  His updated medication list for this problem includes:    Simvastatin 80 Mg Tabs (Simvastatin) .Marland Kitchen... Take one-half tablet by mouth daily at bedtime  Patient Instructions: 1)  Your physician recommends that you continue on your current medications as directed. Please refer to the Current Medication list given to you today. 2)  Your physician wants you to follow-up in: 6 months.  You will receive a reminder letter in the mail two months in advance. If you don't receive a letter, please call our office to schedule the follow-up  appointment.

## 2010-06-27 NOTE — Procedures (Signed)
Summary: Colon   Colonoscopy  Procedure date:  05/18/2002  Findings:      Location:  [facility].   Patient Name: [name], [name] MRN:  Procedure Procedures: Colonoscopy CPT: 16109.    with Hot Biopsy(s)CPT: [id number].    with polypectomy. CPT: A3573898.  Personnel: Endoscopist: Vania Rea. Jarold Motto, MD.  Referred By: Colon Branch, MD.  Exam Location: Outpatient  Patient Consent: Procedure, Alternatives, Risks and Benefits discussed, consent obtained, from patient. Consent was obtained by the RN.  Indications  Evaluation of: Polyps seen on recent Barium Enema.  History  Pre-Exam Physical: Performed May 18, 2002. Cardio-pulmonary exam, Rectal exam, Abdominal exam, Extremity exam, Mental status exam WNL.  Exam Exam: Extent of exam reached: Cecum, extent intended: Cecum.  The cecum was identified by appendiceal orifice and IC valve. Patient position: on left side. Duration of exam: 45 minutes. Colon retroflexion performed. Images taken. ASA Classification: II. Tolerance: excellent.  Monitoring: Pulse and BP monitoring, Oximetry used. Supplemental O2 given. at 2 Liters.  Colon Prep Used Golytely for colon prep. Prep results: good.  Sedation Meds: Patient assessed and found to be appropriate for moderate (conscious) sedation. Fentanyl 100 mcg. given IV. Versed 5 mg. given IV.  Instrument(s): CF 140L. Serial [id number].  Findings - DIVERTICULOSIS: Cecum to Sigmoid Colon. Not bleeding. ICD9: Diverticulosis, Colon: 562.10. Comments: Severe pancolonic diverticular disease. Also obvious here is a ventral hernia making this exam very difficult. Greater tha 10 polyps excised or cauterized.  - MULTIPLE POLYPS: Cecum to Sigmoid Colon. minimum size 3 mm, maximum size 14 mm. Procedure:  snare with cautery, removed, Polyp retrieved, Polyps sent to pathology. ICD9: Colon Polyps: 211.3. Comments: Hot Bx. cautery also used here.   Assessment  Diagnoses: 211.3: Colon  Polyps.  562.10: Diverticulosis, Colon.   Events  Unplanned Interventions: No intervention was required.  Plans  Post Exam Instructions: No aspirin or non-steroidal containing medications: 2 weeks.  Medication Plan: Await pathology. Continue current medications.  Patient Education: Patient given standard instructions for: Polyps. Diverticulosis. Patient instructed to get routine colonoscopy every 1 years.  Disposition: After procedure patient sent to recovery. After recovery patient sent home.  Scheduling/Referral: Clinic Visit, to Vania Rea. Jarold Motto, MD, around Jun 18, 2002.    cc: Colon Branch, MD    This report was created from the original endoscopy report, which was reviewed and signed by the above listed endoscopist.

## 2010-06-27 NOTE — Letter (Signed)
Summary: New Patient letter  [facility]  [address] [place], [place] [address]   Phone: [phone]  Fax: [phone]       09/26/2009 MRN: [id number]  [name] [address] [place], [place]  [address]  Dear [name],  Welcome to the Gastroenterology Division at [facility].    You are scheduled to see Dr.  Jarold Motto on 10-04-09 at 10:00a.m. on the 3rd floor at [facility], [address].  We ask that you try to arrive at our office 15 minutes prior to your appointment time to allow for check-in.  We would like you to complete the enclosed self-administered evaluation form prior to your visit and bring it with you on the day of your appointment.  We will review it with you.  Also, please bring a complete list of all your medications or, if you prefer, bring the medication bottles and we will list them.  Please bring your insurance card so that we may make a copy of it.  If your insurance requires a referral to see a specialist, please bring your referral form from your primary care physician.  Co-payments are due at the time of your visit and may be paid by cash, check or credit card.     Your office visit will consist of a consult with your physician (includes a physical exam), any laboratory testing he/she may order, scheduling of any necessary diagnostic testing (e.g. x-ray, ultrasound, CT-scan), and scheduling of a procedure (e.g. Endoscopy, Colonoscopy) if required.  Please allow enough time on your schedule to allow for any/all of these possibilities.    If you cannot keep your appointment, please call [phone] to cancel or reschedule prior to your appointment date.  This allows [place] the opportunity to schedule an appointment for another patient in need of care.  If you do not cancel or reschedule by 5 p.m. the business day prior to your appointment date, you will be charged a $50.00 late cancellation/no-show fee.    Thank you for choosing  [facility] for your medical needs.  We appreciate the opportunity to care for you.  Please visit [place] at our website  to learn more about our practice.                     Sincerely,                                                             The Gastroenterology Division

## 2010-08-22 ENCOUNTER — Encounter: Payer: Self-pay | Admitting: Gastroenterology

## 2010-10-08 NOTE — Procedures (Signed)
LOWER EXTREMITY ARTERIAL EVALUATION-SINGLE LEVEL   INDICATION:  Followup evaluation of stent graft repair of abdominal  aortic aneurysm on 03/22/2007.   HISTORY:  Diabetes:  No.  Cardiac:  PTCA and stent 2004.  Hypertension:  Yes.  Smoking:  No.  Previous Surgery:  Stent graft repair of abdominal aortic aneurysm  03/22/2007.   RESTING SYSTOLIC PRESSURES: (ABI)                          RIGHT                LEFT  Brachial:               138                  140  Anterior tibial:        138                  138  Posterior tibial:       150 (>1.0)           142 (>1.0)  Peroneal:  DOPPLER WAVEFORM ANALYSIS:  Anterior tibial:        Triphasic            Triphasic  Posterior tibial:       Triphasic            Triphasic  Peroneal:   PREVIOUS ABI'S:  Date:  RIGHT:  LEFT:   IMPRESSION:  No evidence of significant arterial occlusive disease  bilaterally.   ___________________________________________  P. Liliane Bade, M.D.   MC/MEDQ  D:  11/09/2008  T:  11/09/2008  Job:  [id number]

## 2010-10-08 NOTE — Assessment & Plan Note (Signed)
[facility]                            CARDIOLOGY OFFICE NOTE   NAME:VANHOYCheyne, Boulden                       MRN:          [id number]  DATE:01/24/2008                            DOB:          March 02, 1928    PRIMARY CARE PHYSICIAN:  [name], MD   Mr. Normoyle is a 77 years old, who returned for followup management of  coronary heart disease.  He has previously had 3 TAXUS stents placed in  the right coronary artery by [name] and he had another stent placed  by myself for in-stent restenosis in the right coronary artery.  He  subsequently had a Cypher stent placed in the LAD.  He had no evidence  of restenosis at last cath in January 2008.   He has been doing quite well.  He has had no recent chest pain,  shortness of breath, or palpitations.  However, his wife states he has  been extremely irritable recently.   PAST MEDICAL HISTORY:  Significant for abdominal aneurysm and then  underwent stent grafting by Dr. [name].  He also has hypertension,  hyperlipidemia, and colon polyps.  He has carotid bruits, but no  stenosis by Dopplers.   CURRENT MEDICATIONS:  1. Toprol-XL 25 mg one-half tablet daily.  2. Norvasc 5 mg daily.  3. Aspirin.  4. Ambien.  5. Crestor 10 mg.  6. Plavix 75 mg.   PHYSICAL EXAMINATION:  VITAL SIGNS:  The blood pressure is 122/71, the  pulse 57 and regular.  NECK:  There is no venous distention.  The carotid pulses were full with  very short bruits.  CHEST:  Clear.  HEART:  Rhythm is regular.  There are no murmurs or gallops.  ABDOMEN:  Soft.  Normal bowel sounds.  EXTREMITIES:  Peripheral pulses are full.  There is no peripheral edema.   Electrocardiogram is normal.   IMPRESSION:  1. Coronary heart disease status post multiple percutaneous      interventions with drug-eluting stents as described above.  2. Status post abdominal stent graft for abdominal aneurysm.  3. Colon polyps.  4. Hypertension.  5.  Hyperlipidemia.  6. Carotid bruits without major lesions by Doppler.   RECOMMENDATIONS:  Mr. Mcneal is doing well.  His wife states he is quite  irritable.  I will get some blood test to help to evaluate this  including a lipid, liver, CBC, BNP, and TSH.  I will plan to see him  back in followup in a year.  He was a good friends with Marijo Conception, who  passed away recently.     Bruce Elvera Lennox Juanda Chance, MD, [facility]  Electronically Signed    BRB/MedQ  DD: 01/24/2008  DT: 01/25/2008  Job #: [id number]

## 2010-10-08 NOTE — Assessment & Plan Note (Signed)
OFFICE VISIT   RUBERT, FREDIANI  DOB:  1928-02-24                                       01/21/2007  FAOZH#:[id number]   Mr. Shough returns today for routine follow up of his abdominal aortic  aneurysm. This was evaluated in February of this year measuring 4.8  centimeters in maximal diameter by ultrasound. Repeat ultrasound today  reveals enlargement of his aneurysm now measuring 5.4 centimeters in  maximal diameter.   Mr. Meiring has remained free of symptoms. He denies abdominal pain or  back pain.   His blood pressure is 154/82, pulse is 60 per minute and regular,  respirations are 18 per minute.  ABDOMEN:  Reveals no organomegaly. AAA palpable. No tenderness. Normal  bowel sounds without bruits.  EXTREMITIES:  2+ femoral pulses bilaterally. 1+ posterior tibial pulse  bilaterally, no ankle edema.   Mr. Cooks shows enlargement of his aneurysm now at 5.4 centimeters.  This will require further work up. He is scheduled to undergo CT  angiogram of the abdominal aorta and return to the office at that time  for evaluation.   Balinda Quails, M.D.  Electronically Signed   PGH/MEDQ  D:  01/21/2007  T:  01/23/2007  Job:  [id number]   cc:   [name], M.D.  Bruce Elvera Lennox Juanda Chance, MD, [facility]

## 2010-10-08 NOTE — Assessment & Plan Note (Signed)
OFFICE VISIT   [name], [name]  DOB:  1928/04/01                                       05/21/2010  VHQIO#:[id number]   Patient returns today for continued follow-up regarding the aortic stent  graft placed by Dr. [name] in October 2008.  He has had no abdominal or  back symptoms and was last seen by me 1 year ago.  Today he had a duplex  scan in our office, which I ordered and reviewed, and it reveals the  aneurysm to continue to be about 5.2 cm in maximum diameter with no  evidence of endoleak or stenosis.   CHRONIC MEDICAL PROBLEMS:  1. Hypertension.  2. Hyperlipidemia.  3. Coronary artery disease.  4. Negative for COPD or stroke.   SOCIAL HISTORY:  He is widowed.  Is retired.  Has not smoked since 1990.  Does not use alcohol.   REVIEW OF SYSTEMS:  Negative for pulmonary problems such as productive  cough or bronchitis.  Does have some discomfort in the lower extremities  with walking but ambulates 2 miles.  Some decreased visual acuity and  very rarely will have some chest discomfort and a little depression but  all other systems are negative in review of systems.   PHYSICAL EXAMINATION:  Blood pressure 173/78, heart rate 60,  respirations 14.  General:  He is a well-developed and well-nourished  male in no apparent distress, alert and oriented x3.  HEENT:  Normal for  age.  EOMs intact.  Lungs:  Clear to auscultation.  No rhonchi or  wheezing.  Cardiovascular:  Regular rhythm.  No murmurs.  Carotid pulses  are 3+.  No bruits audible.  Abdomen:  Soft, nontender.  No pulsatile  masses noted.  He has 3+ femoral and dorsalis pedis pulses bilaterally.  There is no evidence of any skin rashes.  Neurologic:  Normal.   He does have a stable aneurysm post stent grafting, and we will see him  again in 1 year with a CT angiogram to be done at the time of his return  unless he has any symptoms in the interim.     Quita Skye Hart Rochester, M.D.  Electronically  Signed   JDL/MEDQ  D:  05/21/2010  T:  05/22/2010  Job:  [id number]

## 2010-10-08 NOTE — Procedures (Signed)
DUPLEX ULTRASOUND OF ABDOMINAL AORTA   INDICATION:  Follow up abdominal aortic aneurysm repair.   HISTORY:  Diabetes:  No.  Cardiac:  No.  Hypertension:  Yes.  Smoking:  Previous.  Connective Tissue Disorder:  Family History:  Previous Surgery:  Abdominal aortic aneurysm stent repair on 03/22/2007.   DUPLEX EXAM:         AP (cm)                   TRANSVERSE (cm)  Proximal             Not visualized            Not visualized  Mid                  Not visualized            Not visualized  Distal               5.2 cm                    5.1 cm  Right Iliac          1.3 cm                    1.4 cm  Left Iliac           1.4 cm                    1.3 cm   PREVIOUS:  Date: 05/15/2009 (CT)  AP:  5.2  TRANSVERSE:  5.0   IMPRESSION:  1. Patent abdominal aortic aneurysm stent with no evidence of endoleak      or stenosis, based on limited visualization.  2. The abdominal aortic aneurysm sac size appears stable when compared      to the previous examination.  3. Unable to adequately visualize the proximal to mid abdominal aorta      due to overlying bowel gas patterns.  The patient was not n.p.o.      for this examination.   ___________________________________________  Quita Skye Hart Rochester, M.D.   CH/MEDQ  D:  05/21/2010  T:  05/21/2010  Job:  [id number]

## 2010-10-08 NOTE — Assessment & Plan Note (Signed)
OFFICE VISIT   WAYLAND, BAIK  DOB:  06/04/1927                                       04/29/2007  YNWGN#:[id number]   The patient underwent abdominal aortic stent graft placement for a 5.4  cm abdominal aortic aneurysm on 03/22/2007.  Discharged home in good  condition postoperative day #2.  No perioperative complications evident.   He returns to the office at this time for routine followup.  CT scan  performed reveals AAA stent graft to be in good position.  No change in  native aneurysm size with thrombus filling the sac.  Small amount of  contrast noted external to the stent at the iliac limbs, this is highly  unlikely to be a type 3 leak.  We will plan to follow this.   No complaints from the patient at this time.  BP 124/73, pulse 56 per  minute.  Abdomen soft and nontender.  Right groin incision healing  unremarkably.  2+ femoral pulses bilaterally.   No apparent complicating features associated with the stent graft  placement.  Will plan followup again in 6 months with an ultrasound of  the abdominal aorta.   Balinda Quails, M.D.  Electronically Signed   PGH/MEDQ  D:  04/29/2007  T:  04/30/2007  Job:  [id number]   cc:   [name], M.D.  Bruce Elvera Lennox Juanda Chance, MD, [facility]

## 2010-10-08 NOTE — Procedures (Signed)
DUPLEX ULTRASOUND OF ABDOMINAL AORTA   INDICATION:  Follow-up evaluation of abdominal aortic aneurysm   HISTORY:  Diabetes:  No  Cardiac:  PTCA and stent in 2004  Hypertension:  Yes  Smoking:  No  Connective Tissue Disorder:  Family History:  No  Previous Surgery:  No   DUPLEX EXAM:         AP (cm)                   TRANSVERSE (cm)  Proximal             1.9 Cm                    1.7 cm  Mid                  5.2 cm                    5.4 cm  Distal               2.6 cm                    2.6 cm  Right Iliac          0.9 cm                    1 cm  Left Iliac           0.8 cm                    0.9 cm   PREVIOUS:  Date: 07/23/2006  AP:  4.8  TRANSVERSE:  4.7   IMPRESSION:  Abdominal aortic aneurysm with slightly larger than  previously recorded.   ___________________________________________  P. Liliane Bade, M.D.   MC/MEDQ  D:  01/21/2007  T:  01/22/2007  Job:  [id number]

## 2010-10-08 NOTE — Discharge Summary (Signed)
NAMEVASCO, CHONG             ACCOUNT NO.:  [id number]   MEDICAL RECORD NO.:  [id number]          PATIENT TYPE:  INP   LOCATION:  2006                         FACILITY:  [facility]   PHYSICIAN:  Balinda Quails, M.D.    DATE OF BIRTH:  1928-03-02   DATE OF ADMISSION:  03/22/2007  DATE OF DISCHARGE:  03/24/2007                               DISCHARGE SUMMARY   Patient is for possible discharge if remains stable for today's date of  March 24, 2007.   ADMISSION DIAGNOSIS:  A 5.4 cm abdominal aortic aneurysm.   DISCHARGE/SECONDARY DIAGNOSES:  1. A 5.4 cm abdominal aortic aneurysm.  2. Coronary artery disease.  3. Hyperlipidemia.  4. Hypertension.  5. Possible transient ischemic attacks.  6. Pneumonia in 2005.  7. Cholecystectomy in 2005.   CONSULTS:  None.   PROCEDURES:  1. Endovascular stent for AAA on March 22, 2007 by Dr. Demetrius Charity Bud Face.  2. CT angiogram on March 22, 2007 by Dr. Balinda Quails.   HISTORY:  Mr. Orvan Seen is a 77 year old gentleman followed regularly at  the [facility] office with a known abdominal aortic aneurysm in February of  this year; it measured 4.8 cm in maximal diameter.  Followup ultrasound  in August revealed this to have enlarged to 5.4 cm in maximal diameter.  Mr. Orvan Seen underwent a CT angiogram verifying aneurysm that measured  5.3 cm in maximal diameter.  It; therefore, was recommended he undergo  abdominal aortic aneurysm repair, due to the significant enlargement.  He was agreeable with this, the CT angiogram was reviewed and he was  felt to be a good candidate for placement of an aortic stent graft.  This is planned at this time.  The patient denies any abdominal pain.  No significant back pain.   Patient has no known allergies.   REVIEW OF SYSTEMS:  Patient denies any weight loss or anorexia at the  time of admission, no fever or chills at time of admission.  He does  have a mild intermittent chest pain at time of admission.  Denies  shortness of breath.  No cough or sputum production.  Bowel habits are  regular and there is no abdominal pain.  No dysuria or frequency.  Denies weight gain.  No visual, sensory or motor deficits.  Denies  hearing changes.  Does have mild joint discomfort.   On admission of January 20, 2007, Dr. Denman George used a Star[employer]  Vascular Closure System at the site with the right groin.  Patient was  sent to 3300 after having the insertion of the stent placed on March 22, 2007.  Patient did well.  Labs remained stable.  Patient was  transferred then to 2000 on March 23, 2007.  Patient did well  overnight.  On March 24, 2007, vitals are all stable.  Planning to  discharge this patient.  Blood pressure 127/73, heart rate 71,  respirations 20, temperature 97.9, O2 sats 94%.  Patient states he slept  good, eating and tolerating, no complaint and ready to go home.  PHYSICAL EXAMINATION:  GENERAL:  Patient is alert and oriented x3.  HEART:  Normal.  Regular rate and rhythm.  LUNGS:  Clear with normal breath sounds.  ABDOMEN:  Soft.  Active bowel sounds.  Nondistended.  SKIN:  1.  Incision on right groin is clean, dry with no signs of  infection.  2.  Removed dressing from A line, looks good with no signs  of infection.   ASSESSMENT/PLAN:  At this time, after his endovascular stent is to  discharged to home if patient remains stable and okayed by Dr. [name].  Currently, patient remains neurologically intact, vitals stable,  afebrile.  Most recent lab show hematocrit and hemoglobin 12.8 and 36.7,  white blood count 7.9, platelets 125.  Last B-Met on March 23, 2007:  Sodium 138, potassium 3.9, chloride 109, bicarb 2.4, BUN 10, creatinine  0.99 and glucose was at 95.  Calcium on October 28 was 8.3.  On March 24, 2007, weight is 95.8 kilograms.   DISCHARGE DESTINATION:  Discharge to home if no significant change in  status.  Currently, patient remains in stable condition.    DISCHARGE MEDICATIONS:  1. Plavix 75 mg.  2. Toprol XL 25 mg.  3. Norvasc 5 mg.  4. Aspirin 81 mg.  5. Ambien 10 mg.  6. Crestor 10 mg.  7. Percocet 5/325 mg as needed for pain; this is a new medication.   FOLLOWUP CARE FOR PATIENT:  Is to:  1. Increase activities slowly, walk with assistance, no lifting or      driving for two weeks.  2. Clean wound with soap and water and keep wound dry as possible.  3. Patient is to contact office or ER if swelling, redness, drainage,      fever greater than 101 or signs of bleeding.  4. Patient is to continue medication per home reconciliation med sheet      that he is already taking.  5. Also, patient is to begin taking Percocet 5/325 mg one or two      tablets orally every six hours as needed for pain.  6. A followup appointment has been made with Dr. [name] for four weeks      with an abdominal CT; patient has been instructed to call the      office if any problems occur before then or has any questions at      [phone].  7. Patient is also being sent home with an After your [employer]      procedure care package regarding what he needs to know about his      femoral artery access site.  Patient is in agreement with this      plan.      Cyndy Freeze, PA      P. Liliane Bade, M.D.  Electronically Signed   ALW/MEDQ  D:  03/24/2007  T:  03/24/2007  Job:  [id number]

## 2010-10-08 NOTE — Discharge Summary (Signed)
NAMEBLADE, SCHEFF              ACCOUNT NO.:  [id number]   MEDICAL RECORD NO.:  [id number]          PATIENT TYPE:  INP   LOCATION:  6524                         FACILITY:  [facility]   PHYSICIAN:  Everardo Beals. Juanda Chance, MD, FACCDATE OF BIRTH:  [birth year]   DATE OF ADMISSION:  11/12/2006  DATE OF DISCHARGE:  11/13/2006                               DISCHARGE SUMMARY   PRIMARY CARDIOLOGIST:  [name]   PRIMARY CARE PHYSICIAN:  Dr. Lysbeth Galas   PROCEDURES PERFORMED DURING HOSPITALIZATION:  Cardiac catheterization  performed by [name] on November 13, 2006 revealing LAD 0%,  circumflex mild irregularities, right coronary artery less than 10% in  all three grafts, LV normal, no large wall motion abnormalities.   DISCHARGE DIAGNOSES:  1. Chest pain in patient with known coronary artery disease.        A:  Status post cardiac catheterization November 13, 2006 (please see  Dr. Regino Schultze thorough cardiac catheterization dictation note for further  details).        B:  Status post Taxus stent to PDA, mid-RCA and proximal right  coronary artery on August 29, 2003.        C:  Last cardiac catheterization June 03, 2004 revealing mid-LAD  lesion at 75%, right coronary artery lesion at 75% between the two  stents on the right coronary artery, ejection fraction 60%, both of  these have stents to the right coronary artery and mid-LAD CYPHER  stents.        D:  Chest Myoview 2007 negative for ischemia.  1. Hyperlipidemia.  2. Hypertension.  3. History of possible mini-strokes.  4. History of abdominal aortic aneurysm 4.0 cm.  5. History of cholecystectomy 2005.  6. Pneumonia 2005.   HISTORY OF PRESENT ILLNESS:  [name] is a 77 year old male with a  history of CAD status post five stents and abdominal aortic aneurysm who  presented to the emergency room secondary to chest pain.  The patient  apparently had been having chest pain for the last three days prior to  admission while watching a  ball game which he described as mild left-  sided pain relieved with nitroglycerin.  He described it as soreness  ranging at about 5/10 pain.  The patient also noted on June 18 while  washing widows on a ladder at home, he began to have some left-sided  soreness in his chest, he took nitroglycerin and the pain was relieved.  On the day of admission around lunch time, the patient again had an  episode of chest soreness while watering flowers and his neighbor took  him to the fire station, was given nitroglycerin and four aspirin, and  the pain was relieved.  He noted the pain to be 5/10 with no associated  shortness of breath.  The patient was brought to the emergency room  about 8:30 p.m. on day of admission and was seen and examined by Dr.  Rachael Fee Prime, cardiologist, for [name].  The patient was  admitted to rule out myocardial infarction secondary to recurrent chest  discomfort with known history of CAD.  The patient was seen and examined by [name] on the morning of  November 13, 2006 and cardiac catheterization was completed.  The patient  had no further complaints of chest discomfort overnight prior to  catheterization this a.m.  The patient's cardiac enzymes were found to  be negative x2 with point-of-care troponin 0.05 and troponin of 0.02, CK  91, MB 1.7.  The patient subsequently had cardiac catheterization with  results as discussed above.  The patient tolerated the procedure well  with no evidence of bleeding, hematoma or infection.  The patient was  found to be stable and discharged home late afternoon of November 13, 2006.   DISCHARGE LABS:  Sodium 140, potassium 3.7, chloride 110, CO2 25, BUN  16, creatinine 1.0, glucose 137, hemoglobin 13.4, hematocrit 39.7, white  blood cells 7.0, platelets 162, PT 13.1, INR 1.0, magnesium 2.0,  troponins as described above.   VITAL SIGNS:  Blood pressure 123/60, heart rate 53, respirations 18, O2  sat 97% on two  liters.   DISCHARGE MEDICATIONS:  1. Plavix 75 mg daily.  2. Aspirin 81 mg daily.  3. Crestor 10 mg daily.  4. Norvasc 5 mg daily.  5. Toprol XL 50 mg daily.  6. Omega 3 fish oil 1000 mg daily.  7. Ambien 10 mg at bedtime.   ALLERGIES:  No known drug allergies.   FOLLOWUP PLANS AND APPOINTMENTS:  1. The patient is scheduled to see [name] on July 9 at 8:15      a.m.  2. The patient is to follow up with Dr. Lysbeth Galas, primary care      physician, for continued medical management.  3. The patient has been given post cardiac catheterization      instructions with particular emphasis on right groin site for      evidence of bleeding, hematoma and infection.  4. There are no medication changes prior to admission.   Time spent with the patient to include physician time:  45 minutes.      Bettey Mare. Lyman Bishop, NP      Everardo Beals. Juanda Chance, MD, [facility]  Electronically Signed    KML/MEDQ  D:  11/13/2006  T:  11/14/2006  Job:  [id number]   cc:   [name], M.D.

## 2010-10-08 NOTE — Assessment & Plan Note (Signed)
[facility]                            CARDIOLOGY OFFICE NOTE   NAME:[name], Rubenstein                       MRN:          [id number]  DATE:11/09/2006                            DOB:          04-14-1928    PRIMARY CARE PHYSICIAN:  [name], M.D.   CLINICAL HISTORY:  Mr. Port is 77 years old and returned for  management of his coronary heart disease.  He had 3 Taxus stents placed  by [name] several years ago, and then in 2006 he had a Cipher stent  placed in the right coronary artery for in-stent restenosis and a second  Cipher stent placed in the LAD.  He has done quite well since that time.  He had a Myoview scan done in 2007 that showed some diaphragmatic  attenuation.  He has done quite well since that time.  He has had no  recent chest pain or palpitations.  He does get occasional shortness of  breath with exertion, but this has not changed.   PAST MEDICAL HISTORY:  Notable for hypertension, hyperlipidemia, and he  has a 4.5-cm abdominal  aneurysm followed by Dr. [name].   CURRENT MEDICATIONS:  Include Plavix, aspirin, Crestor, Ambien, Norvasc,  Toprol and Omega 3 fish oil.   PHYSICAL EXAMINATION:  VITAL SIGNS:  Blood pressure is 133/78 and the  pulse 53 and regular.  There is no venous distention.  The carotid  pulses are full without bruits.  CHEST:  Clear.  CARDIAC:  Rhythm was regular.  I could hear no murmurs or gallops.  ABDOMEN:  Soft without organomegaly.  Peripheral pulses are full with no  peripheral edema.   ECG was normal.   IMPRESSION:  1. Coronary artery disease status post multiple percutaneous coronary      interventions with drug eluting stents as described above, stable.  2. Good LV function.  3. Hypertension.  4. Hyperlipidemia.  5. 4.5-cm abdominal aneurysm followed by Dr. [name].   RECOMMENDATIONS:  I think Mr. Fedor is doing well.  We will not make  any changes in his medications today.  His cholesterol  panel was good,  except for a low HDL.  I will plan to see him back in followup in a year  or sooner if he has any problems or sooner if he has any problems.     Bruce Elvera Lennox Juanda Chance, MD, [facility]  Electronically Signed   BRB/MedQ  DD: 11/09/2006  DT: 11/09/2006  Job #: [id number]

## 2010-10-08 NOTE — H&P (Signed)
NAMEDELAN, KSIAZEK              ACCOUNT NO.:  [id number]   MEDICAL RECORD NO.:  [id number]          PATIENT TYPE:  INP   LOCATION:  1823                         FACILITY:  [facility]   PHYSICIAN:  Everardo Beals. Juanda Chance, MD, FACCDATE OF BIRTH:  [birth year]   DATE OF ADMISSION:  11/12/2006  DATE OF DISCHARGE:                              HISTORY & PHYSICAL   The patient is full code. Total visit time approximately 48 minutes.   CARDIOLOGISTEverardo Beals Juanda Chance, MD, [facility]   PRIMARY CARE PHYSICIAN:  [name], M.D.   CHIEF COMPLAINT:  Chest pain.   HISTORY OF PRESENT ILLNESS:  Mr. Shabazz is a 77 year old male with  history of coronary artery disease, status post five stents, history of  abdominal aortic aneurysm in the range of 4.5 cm, history of  hypertension, hyperlipidemia, and obstructive sleep apnea, presents with  chest pain.  The patient notes on November 10, 2006 in the evening while  watching the ball game, he noted some mild left-sided chest pain which  was relieved by nitroglycerin.  He described it as a soreness, ranging  about 5/10 pain.  The patient recently saw [name] and has not had  any chest pain for quite awhile.  The patient then notes on November 11, 2006, while washing the windows at home on a ladder around lunch time,  recurrence of the left-sided soreness in the chest.  The patient notes  he took a nitroglycerin at that time which relieved the pain completely.  The patient on November 12, 2006, at around lunch time had again an episode  of chest soreness after watering the flowers and the neighbor took him  to the fire station and he was given nitroglycerin and four aspirin  which helped the pain.  He notes the pain have been around 5/10 with no  association of shortness of breath or sweats, but he has had some nausea  and just didn't feel right.  No palpitations.  The patient notes he  received those medicines at the fire station.  They took him to the  emergency room  around 8:30 p.m., November 12, 2006.  The patient has not had  any chest pain since.  The patient denies any recent fevers.  He denies  any dysuria.   PAST MEDICAL/SURGICAL HISTORY:  1. Coronary artery disease, status post stenting.  He has a history of      a Taxus  to the PDA, mid RCA and proximal RCA August 29, 2003.  His      last left heart catheterization was June 03, 2004 at which time      he had a mid LAD lesion at 75%, RCA lesion at 75%, between the two      stents on the right coronary and ejection fraction was 60%.  Both      of those got stents to the RCA and mid LAD.  Both Cypher stents.      He had a Myoview in 2007 which was negative for ischemia.  2. History of gallbladder surgery in 2005.  3. Pneumonia in 2005.  4. Hyperlipidemia.  5. Hypertension.  6. History of possible mini strokes.  7. History of abdominal aortic aneurysm 4.5 cm.   MEDICATIONS:  1. Ambien 10 mg by mouth daily.  2. Crestor 10 mg by mouth daily.  3. Toprol XL 50 mg daily.  4. Aspirin 81 mg daily.  5. Plavix 75 mg daily.  Last dose November 11, 2006.  6. Norvasc 5 mg by mouth daily.  7. Fish oil 1000 mg twice a day.   ALLERGIES:  No known drug allergies.   SOCIAL HISTORY:  He discontinued tobacco 20 years ago.  Smoked for a  small amount of time prior.  No alcohol.  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  He is married.   FAMILY HISTORY:  Positive for a mother with coronary artery disease and  congestive heart failure and she did die from this in her 69s due to  complications of congestive heart failure.  Father passed away at 79 due  to complications of a stroke.  He has a twin sister with coronary artery  disease.   REVIEW OF SYSTEMS:  Ten-point review of systems negative as stated  above.   PHYSICAL EXAMINATION:  VITAL SIGNS:  Afebrile with a blood pressure of  128/76, pulse 67, respiratory rate 18.  Oxygen saturation 96% on room  air.  GENERAL:  He is a  male that looks younger than his stated age, sitting  upright in no acute distress.  HEENT:  Pupils are equal, round and reactive to light with extraocular  movements intact.  The oropharynx is dry with no posterior oropharyngeal  lesions.  NECK:  Supple with no lymphadenopathy or thyromegaly.  CARDIOVASCULAR:  Regular rate and rhythm by auscultation with no  displacement of his point of maximal impulse.  He has no carotid bruits,  no jugular venous distension.  LUNGS:  Clear to auscultation bilaterally.  VASCULAR:  2+ pulses, dorsalis pedis and radial.  ABDOMEN:  Soft, nontender, nondistended.  Normoactive bowel sounds.  EXTREMITIES:  No clubbing, cyanosis or edema.  NEUROLOGIC: Alert and oriented x4.  Cranial nerves II-XII grossly  intact. Strength and sensation grossly intact.   LABORATORY DATA:  Sodium 140, potassium 3.7, chloride 110 with bicarb of  25.  BUN 16, creatinine 1.05, glucose 137.  Normal liver function panel  except for an albumin of 3.4.  White count of 6.9, hemoglobin 13.9,  hematocrit 40.3, platelets 171.  His segs are 69%.  Cardiac markers at  2213 on November 12, 2006 were unremarkable as well as November 13, 2006, 2:35  a.m.  The patient's EKG showed sinus bradycardia at a rate of 65.  Normal axis, normal intervals and no ST-T changes.  Compared to June 05, 2004, there are no significant changes.   ASSESSMENT/PLAN:  This is a patient with a history of coronary artery  disease and possibly unstable angina.  We will admit him to [name]  on telemetry and he will receive Lovenox, aspirin and we will continue  Plavix with a first dose now.  Will hold off on the load pending seeing  [name].  I will continue his beta blocker and will give gentle  hydration and will have him NPO for possible coronary blood flow  evaluation in the morning.      Darryl D. Prime, MD   Electronically Signed    ______________________________  Everardo Beals Juanda Chance, MD, [facility]     DDP/MEDQ  D:  11/13/2006  T:  11/13/2006  Job:  [id number]

## 2010-10-08 NOTE — Assessment & Plan Note (Signed)
OFFICE VISIT   [name], [name]  DOB:  1928/04/09                                       05/11/2008  EAVWU#:[id number]   The patient underwent placement of an aortic stent graft for a 5.4 cm  AAA in October 2008.  He has continued to do well.  No complaints  related to the procedure.  No abdominal pain or back pain.   Underwent a CT scan today, reveals his native aneurysm to be 5.3 cm in  maximal diameter.  No evidence of endoleak.  Normal function of the  stent without iliac occlusion.   The patient appears well.  Alert and oriented, no distress.  BP 131/71,  pulse 63 per minute.   Abdomen soft, nontender.  No masses or organomegaly.  Normal bowel  sounds, no bruits.  Femoral pulses 2+ bilaterally.   The patient is doing well following his stent graft procedure.  Will  plan to follow up with him again in 6 months with an ultrasound of the  abdomen.   Balinda Quails, M.D.  Electronically Signed   PGH/MEDQ  D:  05/11/2008  T:  05/12/2008  Job:  [id number]   cc:   [name], M.D.  Bruce Elvera Lennox Juanda Chance, MD, [facility]

## 2010-10-08 NOTE — Procedures (Signed)
ENDOVASCULAR STENT GRAFT EXAM   INDICATION:  Followup, endovascular stent repair of abdominal aortic  aneurysm on 03/22/07.   HISTORY:                           DUPLEX EVALUATION   AAA Sac Size:                 5.2 CM AP             5.4 CM TRV  Previous Sac Size:            5.2 CM AP             5.4 CM TRV  Evidence of an endoleak?      No                    No   Velocity Criteria:  Proximal Aorta                37 cm/sec  Proximal Stent Graft          74 cm/sec  Main Body Stent Graft-Mid     70 cm/sec  Right Limb-Proximal           79 cm/sec  Right Limb-Distal             79 cm/sec  Left Limb-Proximal            91 cm/sec  Left Limb-Distal              89 cm/sec  Patent Renal Arteries?        Yes                   Yes   IMPRESSION:  Patent endovascular stent repair of the abdominal aortic  aneurysm with no evidence of leakage or stenosis noted.   ___________________________________________  P. Liliane Bade, M.D.   CH/MEDQ  D:  11/11/2007  T:  11/11/2007  Job:  [id number]

## 2010-10-08 NOTE — Assessment & Plan Note (Signed)
[facility] HEALTHCARE                            CARDIOLOGY OFFICE NOTE   NAME:VANHOYShafter, Jupin                       MRN:          [id number]  DATE:06/11/2007                            DOB:          1928/02/23    PRIMARY CARE PHYSICIAN:  [name], M.D.   CLINICAL HISTORY:  Mr. Behrle is 77 years old and returned for follow up  management of his coronary heart disease and vascular disease.  He has 3  Taxus stents placed by [name] in the right coronary artery and  subsequently had stenting by myself for instent restenosis, and then  later he had a Cypher drug-eluting stent to the LAD.  In January of year  last year, he had a cardiac catheterization which showed no evidence of  restenosis.   He underwent stent grafting of an abdominal aortic aneurysm by Dr. [name]  in November.  He has done quite well since that time.  He has had no  recent chest pain, shortness breath or palpitations.   CURRENT MEDICATIONS:  1. Plavix.  2. Crestor.  3. Aspirin.  4. Norvasc.  5. Toprol.  6. Ambien.   PHYSICAL EXAMINATION:  VITAL SIGNS:  Blood pressure was 127/74, pulse 63  and regular.  NECK:  There was no venous distension.  Caroid pulses were full without  bruits.  CHEST:  Clear.  CARDIAC:  Rhythm is regular.  No murmurs or gallops.  ABDOMEN:  Soft with normal bowel sounds.  EXTREMITIES:  Peripheral pulses were full.  There is no peripheral  edema.   DIAGNOSTICS:  Electrocardiogram showed sinus rhythm and early  repolarization.   IMPRESSION:  1. Coronary artery disease, status post multiple percutaneous      interventions with drug-eluting stents as described above.  2. Status post recent abdominal aortic aneurysm stent graft placement      for abdominal aneurysm.  3. Colon polyps.  4. Hypertension.  5. Hyperlipidemia.  6. Carotid bruits with no evidence of major lesions by recent      Dopplers.   RECOMMENDATIONS:  Mr. Hevener is doing well.  We  will plan to continue  his current medications.  He is more than a year since his last drug-  eluting stent, so I think he can come off  the Plavix for a colonoscopy.  I have recommended that he not  discontinue the aspirin, but that he should continue this through the  colonoscopy.  I will plan to see him back in 6 months.     Bruce Elvera Lennox Juanda Chance, MD, [facility]  Electronically Signed    BRB/MedQ  DD: 06/11/2007  DT: 06/12/2007  Job #: [id number]

## 2010-10-08 NOTE — Procedures (Signed)
ENDOVASCULAR STENT GRAFT EXAM   INDICATION:  Followup evaluation of stent graft placed on 03/22/2007.   HISTORY:                           DUPLEX EVALUATION   AAA Sac Size:                 5.2 CM AP             5.4 CM TRV  Previous Sac Size:            5.2 CM AP             5.4 CM TRV  Evidence of an endoleak?      No                    No   Velocity Criteria:  Proximal Aorta                57 cm/sec  Proximal Stent Graft          48 cm/sec  Main Body Stent Graft-Mid     42 cm/sec  Right Limb-Proximal           37 cm/sec  Right Limb-Distal             64 cm/sec  Left Limb-Proximal            65 cm/sec  Left Limb-Distal              80 cm/sec  Patent Renal Arteries?        Yes                   Yes   IMPRESSION:  Patent aortoiliac stent graft with no evidence of an  endoleak.   ___________________________________________  P. Liliane Bade, M.D.   MC/MEDQ  D:  11/09/2008  T:  11/09/2008  Job:  [id number]

## 2010-10-08 NOTE — Assessment & Plan Note (Signed)
OFFICE VISIT   [name], [name]  DOB:  1928-03-26                                       11/11/2007  JYNWG#:[id number]   The patient underwent AAA stent graft placement for a 5.4 cm AAA on  03/22/2007.  He has continued to do well since his procedure.  No  complaints at this time.  Denies abdominal pain or back pain.  No  claudication symptoms.   He underwent abdominal ultrasound which reveals his aneurysm to measure  5.4 cm in maximal diameter, no change in size.  No evidence of endoleak.  The iliac limbs are patent bilaterally.  Renal arteries patent  bilaterally.   The patient looks well.  He is alert, oriented in no acute distress.  Blood pressure 128/77, pulse is 50 per minute, respirations 18 per  minute.  His abdomen is soft and nontender.  No masses or organomegaly.  Normal bowel sounds without bruits.  2+ femoral pulses bilaterally.  No  ankle edema.   The patient continues to do well following his AAA stent procedure.  I  will plan followup in 6 months with a CT scan.   Balinda Quails, M.D.  Electronically Signed   PGH/MEDQ  D:  11/11/2007  T:  11/11/2007  Job:  [id number]   cc:   [name], M.D.  Bruce Elvera Lennox Juanda Chance, MD, [facility]

## 2010-10-08 NOTE — Assessment & Plan Note (Signed)
[facility]ALTHCARE                                 ON-CALL NOTE   NAME:VANHOYAriana, Cavenaugh                       MRN:          [id number]  DATE:11/14/2006                            DOB:          05/22/1928    PRIMARY CARDIOLOGIST:  Everardo Beals. Juanda Chance, MD.   Mr. Berthold had a cardiac catheterization on November 13, 2006, and was  discharged on November 13, 2006.  He called on November 14, 2006, saying he had  gotten up that morning and noticed that the bandage over his  catheterization site was covered in blood.  He stated that there was no  swelling underneath it and he did not feel bad and was not having severe  pain but wondered what he should do.   I advised Mr. Caraway that he should probably be checked.  I advised him  that he should not drive himself and if he had any concerns at all, he  should call 9-1-1.  He stated he was not having chest pain or shortness  of breath and could walk on the leg, although it was slightly sore.  He  stated he would have his wife bring him to the emergency room for  evaluation.      Theodore Demark, PA-C  Electronically Signed      Rollene Rotunda, MD, [facility]  Electronically Signed   RB/MedQ  DD: 11/15/2006  DT: 11/16/2006  Job #: [id number]

## 2010-10-08 NOTE — Assessment & Plan Note (Signed)
OFFICE VISIT   TAHSIN, [name]  DOB:  December 01, 1927                                       05/15/2009  OZDGU#:[id number]   The patient is a 77 year old male patient who underwent aortic stent  grafting by Dr. [name] in October of 2008 of a 5.4 cm infrarenal aortic  aneurysm.  He has done very well since that time with no abdominal or  back symptoms.  He was last seen 6 months ago with a duplex scan in our  office revealing the aneurysm to be 5.2 cm in diameter with no evidence  of endo leak.  Today he had a CT angiogram which I have reviewed and it  does reveal no evidence of endo leak with continued diameter of the  aneurysm sac measuring 5.2 x 5 cm.  There has been no migration or  problems with his stent graft.   CHRONIC STABLE PROBLEMS:  1. Include hypertension.  2. Hyperlipidemia.  3. Coronary artery disease.  4. Negative for stroke or diabetes or COPD.   FAMILY HISTORY:  Positive for coronary artery disease in his mother,  stroke in his father.  Negative for diabetes.   SOCIAL HISTORY:  He is married.  He is retired.  Does not use tobacco or  alcohol.   REVIEW OF SYSTEMS:  Negative for chest pain, dyspnea on exertion, PND,  orthopnea.  No chronic bronchitis, productive cough, hemoptysis,  wheezing.  All other systems in review of systems are negative.   PHYSICAL EXAMINATION:  Blood pressure 170/89, heart rate 64,  respirations 22.  Generally he is a well-developed, well-nourished male  who is in no apparent distress, alert and oriented x3.  Neck is supple,  3+ carotid pulses palpable.  HEENT exam is unremarkable.  Chest clear to  auscultation.  Cardiovascular exam is regular rhythm, no murmurs.  Abdomen is soft, nontender, no pulsatile mass.  Musculoskeletal exam  reveals no deformities.  Neurologic exam is normal.  Lower extremity  exam reveals 3+ femoral and 3+ posterior tibial pulses bilaterally.   IMPRESSION:  Two years post aortic stent  grafting doing well with no  evidence of endo leak.  The plan is to return in 1 year with a duplex  scan of the aneurysm with a stent graft protocol to be done in our  office.  He will then return on an annual basis alternating CT  angiograms with duplex scans in the office.     Quita Skye Hart [place], M.D.  Electronically Signed   JDL/MEDQ  D:  05/15/2009  T:  05/16/2009  Job:  [id number]

## 2010-10-08 NOTE — Cardiovascular Report (Signed)
[name]              ACCOUNT NO.:  [id number]   MEDICAL RECORD NO.:  [id number]          PATIENT TYPE:  INP   LOCATION:  6524                         FACILITY:  [facility]   PHYSICIAN:  Alex Beals. Juanda Chance, MD, FACCDATE OF BIRTH:  1927-05-30   DATE OF PROCEDURE:  11/13/2006  DATE OF DISCHARGE:                            CARDIAC CATHETERIZATION   CLINICAL HISTORY:  [name] is 77 years old and has had two drug-  eluting stents placed in the right coronary artery by [name], and a  subsequent third drug-eluting stent placed by myself in the right  coronary artery, and he has had a drug-eluting stent in the LAD which is  a CYPHER placed by myself.  I saw him recently and he was doing well but  subsequent to that he developed symptoms of chest pain while working in  his yard.  He came to the hospital via EMS and was admitted by the  fellow last night and we made a decision about with angiography.  His  markers have been negative.  And, his electrocardiogram has been normal.   PROCEDURE:  The procedure was performed via the right femoral artery  using an arterial sheath and 6-French preformed coronary catheters.  A  front wall arterial puncture was performed and Omnipaque contrast was  used.  A distal aortogram was performed to evaluate a known abdominal  aortic aneurysm.  The right femoral artery was closed with Angio-Seal  anesthesia.  The patient tolerated the procedure well and left the  laboratory in satisfactory condition.   RESULTS:  The aortic pressure is 137/71 with a mean of 97, left  ventricular pressure was 137/19.   The left main coronary artery was free of significant disease.   The left anterior descending artery gave rise to a small and large  diagonal branch, two septal perforators, and two more diagonal branches.  There was 0% stenosis at the stent site in the midportion of the vessel.  There was no other significant disease.   The circumflex artery gave  rise to a ramus branch, a marginal branch,  and a posterolateral branch.  These vessels were free of significant  disease.   The right coronary artery was a moderately large vessel that gave rise  to conus branch, a right ventricular branch, a posterior descending  branch, and two posterolateral branches.  There was less than 10%  narrowing at the stent in the proximal right coronary.  There was also  less than 10% stenosis at the overlapping stents in the mid and distal  right coronary artery.  Distal vessel is free of major obstruction.   LEFT VENTRICULOGRAM:  Was performed in the RAO projection.  It showed  good wall motion with no areas of hypokinesis.  The estimated ejection  fraction was 60%.   DISTAL AORTOGRAM:  Was performed which showed patent renal arteries.  There was a large well localized aneurysm in the distal aorta to the  bifurcation.  This arose well below the renals.   CONCLUSION:  1. Coronary artery disease, status post prior previous percutaneous  coronary interventions as described above with 0% stenosis at the      stent site in the mid left anterior descending artery, no      significant obstruction of circumflex artery, less than 10%      stenosis at the stent site in the proximal right coronary artery,      and less than 10% stenosis at the overlapping stents in the mid-to-      distal right coronary artery, with 30% proximal stenosis in the      right coronary artery, and normal left ventricular function.  2. Large abdominal aortic aneurysm which is 4.8 by last ultrasound.   RECOMMENDATIONS:  All of [name] stents are patent.  There does not  appear to be any obstructive coronary disease.  The etiology of his  symptoms is not clear.  I will discuss this further with him and decided  if we need to do any further evaluation.  We will plan discharge later  today.      Alex Elvera Lennox Juanda Chance, MD, [facility]  Electronically Signed     BRB/MEDQ  D:   11/13/2006  T:  11/13/2006  Job:  [id number]   cc:   [name], M.D.  Cardiopulmonary Lab

## 2010-10-08 NOTE — Assessment & Plan Note (Signed)
HEALTHCARE                            CARDIOLOGY OFFICE NOTE   NAME:[name], Burnard                       MRN:          [id number]  DATE:12/02/2006                            DOB:          April 23, 1928    REFERRING PHYSICIAN:  [name], M.D.   [name] is a 77 year old white male patient of [name] who has  history of coronary artery disease status post PCI and stenting to the  RCA with 3 Taxus drug-eluting stents by [name] in the past.  He is  status post stenting of the RCA for end stent restenosis with a Cypher  drug-eluting stent as well a Cypher drug-eluting stent in the LAD by Dr.  Juanda Chance in January 2006.  He recently had recurrent chest pain, and  underwent cardiac catheterization by [name] on November 13, 2006.  He had zero stenosis at the stent site in the mid LAD.  There was no  significant obstruction in the circumflex, and less than 10% stenosis at  the stent site in the RCA, and less than 10% stenosis in the overlapping  stents in the mid to distal RCA with 30% proximal stenosis in the RCA,  and normal LV function.   The patient returned to the hospital the next day because he had some  blood on his right groin bandage.  When he went to the emergency room,  it was a very small amount.  There was no hematoma or evidence of  pseudoaneurysm, and they just redressed him.  There was no active  bleeding.  Since the patient has been home, he has done quite well.  He  has had no recurrent chest pain, shortness of breath, dizziness, or  presyncope.  He has no further bleeding at his catheterization site, and  feels quite well.   CURRENT MEDICATIONS:  1. Plavix 75 mg daily.  2. Crestor 10 mg daily.  3. Aspirin 81 mg daily.  4. Ambien 10 mg daily.  5. Norvasc 5 mg daily.  6. Toprol XL 50 mg 1/2 daily.  7. Omega 3 fish oil daily.   PHYSICAL EXAMINATION:  This is a very pleasant 77 year old white male,  in no acute  distress.  Blood pressure 134/66.  Pulse 66.  Weight 214.  NECK:  Without JVD or HJR.  He does have bilateral carotid bruits.  LUNGS:  Decreased breath sounds at the right base, otherwise clear.  HEART:  Regular rate and rhythm at 60 beats per minute.  Normal S1 and  S2.  No murmur, rub, bruit, thrill, or heave noted.  ABDOMEN:  Soft without organomegaly, masses, lesions, or abnormal  tenderness.  RIGHT GROIN:  Without hematoma, hemorrhage or knot.  He has slight  ecchymosis.  LOWER EXTREMITIES:  Without cyanosis, clubbing, or edema.  He has good  distal pulses.   IMPRESSION:  1. Coronary artery disease.  Catheterization on November 13, 2006 showed      no restenosis of prior stents.  Good left ventricular function.  2. Status post stenting of the right coronary artery with 3 Taxus drug-  eluting stents by [name] in the past.  3. Status post Cypher stent to the right coronary artery for end stent      restenosis and Cypher stent to the left anterior descending in      January 2006.  4. A 4.5-cm abdominal aortic aneurysm on recent CT followed by Dr.      [name].  5. Hypertension.  6. Hyperlipidemia.  7. Carotid bruits.  8. Insomnia.   PLAN:  At this time patient is doing quite well from a cardiac  standpoint.  I have ordered carotid Dopplers.  Follow up with [name]  in 4 months.  He already has followup scheduled with Dr. [name]  concerning his abdominal aortic aneurysm.  He is to call if he has any  further problems.      Jacolyn Reedy, PA-C  Electronically Signed      Jonelle Sidle, MD  Electronically Signed   ML/MedQ  DD: 12/02/2006  DT: 12/02/2006  Job #: [id number]   cc:   [name], M.D.

## 2010-10-08 NOTE — Assessment & Plan Note (Signed)
OFFICE VISIT   EUAL, LINDSTROM  DOB:  11-23-1927                                       02/04/2007  ZOXWR#:[id number]   Patient returns today with the results of his CT scan.  This does verify  a 5.4 cm abdominal aortic aneurysm.  The architecture of this certainly  looks like it can be treated with an aortic stent graft.  I have  informed him of these results.  I have recommended we go ahead and  arrange stent graft placement.  He is agreeable to this.  Therefore, we  will proceed, as noted.   Balinda Quails, M.D.  Electronically Signed   PGH/MEDQ  D:  02/04/2007  T:  02/05/2007  Job:  [id number]

## 2010-10-08 NOTE — Op Note (Signed)
[name], [name]             ACCOUNT NO.:  [id number]   MEDICAL RECORD NO.:  [id number]          PATIENT TYPE:  INP   LOCATION:  2006                         FACILITY:  [facility]   PHYSICIAN:  Balinda Quails, M.D.    DATE OF BIRTH:  [birth year]   DATE OF PROCEDURE:  03/22/2007  DATE OF DISCHARGE:  03/24/2007                               OPERATIVE REPORT   SURGEON:  Balinda Quails, M.D.   ASSISTANT:  Larina Earthly, M.D.   ANESTHETIC:  General endotracheal.   ANESTHESIOLOGIST:  Sheldon Silvan, M.D.   PREOPERATIVE DIAGNOSIS:  A 5.4-cm abdominal aortic aneurysm.   POSTOPERATIVE DIAGNOSIS:  A 5.4-cm abdominal aortic aneurysm.   PROCEDURE:  Endovascular aneurysm repair with Endologix PowerLink stent,  25 x 16 x 155 main body, 25 x 25 x 75 proximal cuff).   CLINICAL NOTE:  [name] is a 77 year old gentleman with an  enlarging abdominal aortic aneurysm.  The most recent measurements  revealed this to be 5.4 cm in maximal diameter.  This has increased  significantly.  CT scan revealed an AAA which was amenable to aortic  stent graft placement.  This is planned at this time.   OPERATIVE PROCEDURE:  The patient was brought to the operating room in  stable hemodynamic condition.  Placed under general endotracheal  anesthesia.  The abdomen and both legs were prepped and draped in  sterile fashion.   Right groin oblique skin incision made at the inguinal ligament.  Dissection carried through the subcutaneous tissue and lymphatics with  electrocautery.  The inguinal ligament identified and the right common  femoral artery mobilized up to the inguinal ligament and encircled  proximally with vessel loop.  Distal dissection carried along the common  femoral artery to expose the profunda and superficial femoral arteries  which were each encircled with vessel loops.  The right common femoral  artery revealed an excellent pulse and mild to moderate atherosclerotic  disease.  There was a  soft anterior wall.   The left common femoral artery was accessed percutaneously.  This  represents the contralateral side.  This was accessed percutaneously  with a an 18 gauge needle and a 0.035 J-wire.  The needle was removed  and a 9-French sheath placed over the guidewire into the left common  femoral artery __________.   The patient was then administered 7000 units of heparin intravenously.  The right common femoral artery, the ipsilateral side was accessed with  an 18-gauge needle and 0.035 Wholey guidewire advanced up into the  abdominal aorta.  A 12-French sheath advanced over the guidewire and the  dilator removed.   A snare sheath was then advanced over the ipsilateral guidewire up into  the abdominal aorta.  The snare was then passed through the sheath into  the abdominal aorta.  The contralateral guidewire was grasped with the  snare, brought down into the right iliac system and out into the  ipsilateral 12-French sheath.  A dual-lumen catheter was then advanced  from the contralateral to ipsilateral side over the guidewire.  This was  then positioned so the  second lumen was at the 11 o'clock site.  An  Amplatz super stiff guidewire was then advanced through the second lumen  and up into the abdominal aorta.   The main device a 25 x 16 x 155 Endologix PowerLink bifurcated stent  graft was then loaded onto the Amplatz super-stiff guidewire.  The  contralateral limb was then brought through, the right femoral sheath  removed and the tearaway component used to release the sheath.  The main  body of the device was then advanced up into the abdominal aorta.  The  contralateral limb was exposed and the main body of the device brought  directly down onto the bifurcation.  The main component of the  bifurcated device was then deployed by advancing the pusher rod.  The  contralateral limb was then released.   The nose cone was brought down into the right common iliac artery.   A  0.014 Confianza guidewire was then passed through the contralateral limb  wire and up into the abdominal aorta.  A pigtail catheter was advanced  up into the abdominal aorta.  A proximal abdominal aortogram was  obtained.  This revealed the proximal extent of the main body to be well  below the renal arteries.  Therefore, a proximal cuff was chosen.  This  is a 25.70 25 and 25 x seventy-five cuff.  The sheath was then removed  from the ipsilateral side and the sheath containing the cuff advanced up  over the ipsilateral guidewire.  This was adjusted to the immediate  infrarenal position and deployed.   Following deployment the right femoral sheath removed and a 16-French  sheath advanced over the guidewire.  The dilator removed.   A 26-mm Coda balloon was then advanced through the ipsilateral limb and  inflated at the proximal margin of the cuff and also at the junction  point.  This was brought down to the bifurcation and was inflated at the  bifurcation with contralateral bifurcation inflated with a 10 x 4  Powerflex balloon.  The balloons were then removed.  Pigtail catheter  then reinserted and a completion arteriogram obtained.  This revealed an  excellent technical result without evidence of significant type I or  type III leak.  There was no evident type II or IV leak.   A retrograde left femoral arteriogram was then made in the LAO  projection.  This revealed the left common femoral sheath to be in the  common femoral artery.  Therefore, the guidewire was reinserted, the  sheath removed, and a StarClose sheath advanced over guidewire.  The  guidewire removed.  The StarClose device advanced through the sheath,  locked in position, brought out through the arterial wall and deployed  without complication.   On the ipsilateral right side the 16-French sheath was removed along the  guidewire and the femoral vessels controlled with clamps.  The  transverse arteriotomy was closed  with running 5-0 Prolene suture.  Adequate flushing carried out.  Clamps were then removed.  Excellent  flow present.  Adequate hemostasis obtained.  The patient administered  50 mg of protamine intravenously.   The right groin was then closed with running 2-0 Vicryl suture in two  subcutaneous layers.  Skin closed with 4-0 Monocryl.  Dermabond applied.  There were no apparent complications.  The patient was transferred to  the PACU in stable condition.      Balinda Quails, M.D.  Electronically Signed     PGH/MEDQ  D:  08/03/2007  T:  08/04/2007  Job:  [id number]   cc:   Everardo Beals. Juanda Chance, MD, [facility]  [name], M.D.

## 2010-10-08 NOTE — Assessment & Plan Note (Signed)
OFFICE VISIT   [name], [name]  DOB:  03/31/1928                                       11/09/2008  ZOXWR#:[id number]   The patient underwent repair of an abdominal aortic aneurysm with an  endovascular stent graft in October 2008.  He has continued to do well.  No complaints at this time.  Denies abdominal pain or back pain.   He underwent an abdominal ultrasound, which reveals his native aorta  measured 5.4 cm in maximal diameter.  No significant change in size.  No  evidence of endoleak.  Velocities through the graft are normal without  evidence of complicating features.  Patent renal arteries bilaterally.  Ankle-brachial indices measure greater than 1.0 bilaterally.   The patient feels well.  Blood pressure is 149/84, pulse is 47 per  minute.  His abdomen is soft, nontender.  No masses or organomegaly.  Intact bowel sounds.  No bruits.  Intact femoral pulses bilaterally.  Pedal pulses intact.   The patient is doing well following his aortic stent graft procedure.  We will plan to follow up with a CT scan.   Balinda Quails, M.D.  Electronically Signed   PGH/MEDQ  D:  11/09/2008  T:  11/10/2008  Job:  [id number]   cc:   Everardo Beals. Juanda Chance, MD, [facility]  [name], M.D.

## 2010-10-08 NOTE — H&P (Signed)
NAMEJEZREEL, [name]              ACCOUNT NO.:  [id number]   MEDICAL RECORD NO.:  [id number]          PATIENT TYPE:  INP   LOCATION:  2550                         FACILITY:  [facility]   PHYSICIAN:  Balinda Quails, M.D.    DATE OF BIRTH:  [birth year]   DATE OF ADMISSION:  03/22/2007  DATE OF DISCHARGE:                              HISTORY & PHYSICAL   CARDIOLOGIST:  Everardo Beals. Juanda Chance, MD   PRIMARY CARE PHYSICIAN:  [name], M.D.   ADMISSION DIAGNOSIS:  A 5.4-cm abdominal aortic aneurysm.   HISTORY:  [name] is 77 year old gentleman followed regularly at this  [facility] office with a known abdominal aortic aneurysm.  In February of this  year, this measured 4.8 cm in maximal diameter.  Follow-up ultrasound in  August revealed this to have enlarged to 5.4 cm in maximal diameter.  [name] underwent a CT angiogram verifying aneurysm that measured 5.3  cm in maximal diameter.  It therefore was recommended he undergo  abdominal aortic aneurysm repair, due to significant enlargement.  He  was agreeable with this, the CT angiogram was reviewed, and he is felt  to be a good candidate for placement of an aortic stent graft.  This is  planned at this time.   The patient denies any abdominal pain.  No significant back pain.   PAST MEDICAL HISTORY:  1. Coronary artery disease.  2. Hyperlipidemia.  3. Hypertension.  4. Possible TIAs.  5. Pneumonia 2005.  6. Cholecystectomy 2005.   MEDICATIONS:  1. Plavix 75 mg daily.  2. Toprol XL 25 mg daily.  3. Norvasc 5 mg daily.  4. Aspirin 81 mg daily.  5. Ambien 10 mg daily.  6. Crestor 10 mg daily.   ALLERGIES:  NONE KNOWN.   REVIEW OF SYSTEMS:  The patient denies any weight loss are anorexia.  No  fever or chills.  He does have mild intermittent chest pain.  Denies  shortness of breath.  No cough or sputum production.  Bowel habits are  regular, and there is no abdominal pain.  No dysuria or frequency.  Denies leg pain.  No visual  sensory or motor deficits.  Denies hearing  changes.  Does have mild joint discomfort.   SOCIAL HISTORY:  The patient discontinued tobacco use approximately 20  years ago.  He was never a heavy tobacco user.  No alcohol use.  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  He is married.   FAMILY HISTORY:  Mother had coronary artery disease, congestive heart  failure, died in her 19s.  Father died in his mid-60s, due to  complications of stroke.  He has a twin sister with coronary artery  disease.   PHYSICAL EXAMINATION:  GENERAL:  Well-appearing male.  No acute  distress.  Appears his stated age.  VITAL SIGNS:  BP is 140/80, pulse is 77 per minute, respirations 18 per  minute, temperature 97 degrees.  HEENT:  Mouth and throat clear.  Normocephalic.  Extraocular movements  intact.  NECK:  Supple, no thyromegaly or adenopathy.  CARDIOVASCULAR:  Bilateral  carotid bruits.  Heart sounds normal without  murmurs.  Regular rate and rhythm.  Chest:  Distant breath sounds.  No rales or rhonchi.  Normal percussion.  ABDOMEN:  Soft.  Nontender.  AAA palpable.  No organomegaly or other  masses felt.  Normal bowel sounds, no abdominal bruits.  MUSCULOSKELETAL:  No joint deformity.  Full range of motion.  NEUROLOGIC: Alert and oriented.  Cranial nerves intact.  Grip strength  equal bilaterally.  1+ reflexes.   FINAL IMPRESSION:  1. Enlarging 5.4-cm infrarenal abdominal aortic aneurysm.  2. Coronary artery disease.  3. Hypertension.  4. Hyperlipidemia.  5. Possible transient ischemic attacks.   ADMISSION PLAN:  The patient will be admitted the morning of March 22, 2007 for planned abdominal aortic aneurysm repair with endovascular  stent graft.      Balinda Quails, M.D.  Electronically Signed     PGH/MEDQ  D:  03/22/2007  T:  03/22/2007  Job:  [id number]   cc:   Balinda Quails, M.D.  Bruce Elvera Lennox Juanda Chance, MD, [facility]  [name], M.D.

## 2010-10-11 NOTE — Cardiovascular Report (Signed)
[name], [name]                          ACCOUNT NO.:  [id number]   MEDICAL RECORD NO.:  [id number]                   PATIENT TYPE:  INP   LOCATION:  6533                                 FACILITY:  [facility]   PHYSICIAN:  Cristy Hilts. Jacinto Halim, M.D.                  DATE OF BIRTH:  1927/06/11   DATE OF PROCEDURE:  08/29/2002  DATE OF DISCHARGE:                              CARDIAC CATHETERIZATION   PROCEDURE PERFORMED:  1. Left ventriculography.  2. Selective right and left coronary arteriography.  3. Abdominal aortogram.   INDICATIONS FOR PROCEDURE:  The patient is a 74-year-active white male with  no known significant past medical history who was admitted to the hospital  with chest pain suggestive of unstable angina.  This was very classic for  unstable angina.  Given his classic presentation to the hospital with  unstable angina, he was directly brought to the cardiac catheterization lab  to evaluate his coronary anatomy.   HEMODYNAMIC DATA:  The left ventricular pressure 106/5 with end diastolic  pressure of 11 mmHg  The aortic pressures were 106/48 with a mean of 78  mmHg.  There was no pressure gradient across the aortic valve.   ANGIOGRAPHIC DATA:  Left ventricle:  The left ventricular systolic function  was normal.  The ejection fraction was estimated at 60%.   Right coronary artery:  The right coronary artery is a very large-caliber  vessel.  It is a super-dominant right coronary artery.  The proximal segment  has 80%.  The mid segment has tandem 80% and 95% stenosis.  The PDA has 95%  stenosis.  It also gives origin to a large PLV.   Left ventricle:  The left ventricle is a large to moderate-caliber vessel.  It is normal.   Circumflex artery:  The circumflex coronary artery is a medium-sized vessel.  It does not have significant luminal irregularity.   Left anterior descending artery:  The left anterior descending artery is a  moderate-to-large caliber vessel.  It ends  before reaching the apex.  It  gives origin to a moderate-sized diagonal-1, which has proximal 40%  stenosis.  The LAD itself has mid 20% stenosis.   ABDOMINAL AORTOGRAM:  The abdominal aortogram revealed an abdominal aortic  aneurysm measuring approximately about 3.5 to 4 cm x 6 to 7 cm in length.  There is 3.5 to 4 cm in width and 5.5 to 6 cm in length.  This was at the  distal abdominal aorta at the aortoiliac bifurcation.   IMPRESSION:  1. High-grade, long segment right coronary artery stenosis which is a super     dominant right coronary artery.  High-grade mid posterior descending     artery stenosis.  2. Left anterior descending and circumflex are moderate-caliber vessels and     mild disease.  3. A large abdominal aortic aneurysm.   RECOMMENDATIONS:  We will proceed with  intervention of the right coronary  artery.  The patient will need evaluation for abdominal aortic aneurysm.   INTERVENTION DATA:  1. Successful percutaneous transluminal coronary angioplasty and stenting of     the PDA branch with 2.5 x 12 mm TAXUS stent deployed at 16 atmospheres of     pressure with reduction of stenosis to 95% to 0%.  There was a nice step-     up noted in the outflow of the stent.  2. Successful percutaneous transluminal coronary angioplasty and stenting of     the mid and proximal right coronary artery with a 3.5 x 32 mm TAXUS stent     both deployed at 16 atmospheres of pressure.  These were post dilated     with 4.0 x 12 mm Quantum balloon at 16 atmospheres of pressure.  Overall,     the reduction of stenosis was from 80-95% to 0% with TIMI III to TIMI III     flow maintained.  There was no evidence of dissection noted or evidence     of thrombus at the end of this procedure.   RECOMMENDATIONS:  1. The patient will be continued on Plavix for at least a period of one     year.  I will check his lipids.  Start the patient on lipid-lowering     therapy.  2. He has a large abdominal  aortic aneurysm, and he will need further     evaluation for abdominal aortic aneurysm.  3. The patient will follow up as an outpatient for further evaluation and     further cardiac management.   TECHNIQUE OF PROCEDURE:  Under the usual sterile precautions, using 6-French  right femoral access, a 6-French multipurpose B2 catheter was advanced into  the ascending aorta over a 0.035 inch J-wire.  The catheter was then  advanced into the left ventricle.  Left ventricular pressures were  monitored.  Hand contrast injection of the left ventricle was performed both  in the LAO and RAO projections.  The catheter was flushed with saline and  pulled back into the ascending aorta, and pressure gradient across the  aortic valve was monitored.  The right coronary artery was selectively  engaged, and angiography was performed.  In the same fashion, the left main  coronary artery was selectively engaged and angiography was performed.  Then  the catheter was pulled back into the abdominal aorta, and abdominal  aortogram was performed.  Then, the catheter was pulled out of the body.   TECHNIQUE OF INTERVENTION:  The 6-French sheaths were exchanged for a 7-  French sheath.  Angiomax was utilized for anticoagulation.  A 7-French FR4  guide was advanced into the ascending aorta in the usual fashion; and in a  similar fashion, right coronary artery was selectively engaged.  A side hole  catheter was utilized.  After engaging the right coronary artery, a 109 cm x  0.214 inch ATW marker guidewire was utilized to cross the right coronary  artery stenosis, and the tip of the wire was carefully pushed into the  distal PDA.  There was initially difficulty in advancing the tip of the wire  through the mid PDA stenosis.  Then initially a 2.5 x 10 mm cutting balloon  was utilized to perform angioplasty into the PDA branch.  Multiple balloon inflations were performed at the inflow of the stent at 4 atmospheres of   pressure for 35 seconds x3.  The balloon was deflated and pulled back  into  the guiding catheter.  Arteriography was performed.  There was still tight  stenosis noted in the PDA.  Then because I was unable to advance the cutting  balloon through the lesion, a 2.0 x 15 mm Maverick was utilized to cross  into the same lesion, and angioplasty was performed at a maximum of 16  atmospheres of pressure, initially at 14 x2 and then at 16 for about one  minute each.  The balloon was deflated and pulled back into the guiding  catheter, and a 2.5 x 10 cutting balloon was re-advanced into the lesion,  and again, there was difficulty in advancing the cutting balloon into the  lesion.  Hence, the cutting balloon was pulled out of the body in the usual  fashion.  There was residual 50% stenosis in the PDA branch.  Then it was  decided to leave the lesion alone and proceed with stenting of the mid and  proximal right coronary artery.  The lesion length was measured with the  help of the marker wires.  A 3.5 x32 mm TAXUS stent was advanced into the  mid RCA, and the stent was deployed at a maximum of 16 atmospheres of  pressure for 65 seconds.  The balloon was deflated and pulled back into the  inflow of the stent to measure the proximal lesion length.  Then the balloon  was withdrawn out of the body.  Then a second 3.5 x 32 mm TAXUS stent was  advanced into the proximal right coronary artery, and after overlapping the  stent, the stent was deployed at 16 atmospheres of pressure for a pressure  of 65 seconds.  The balloon was deflated and pulled in a guiding catheter.  Intracoronary nitroglycerin was administered, and angiography was performed.  The proximal and mid stent was then post-dilated with 4.0 x12 mm Quantum  balloon at 16 atmospheres of pressure from 24 to 60 seconds.  After  performing multiple balloon inflations, the balloon was returned to the  guiding catheter, and arteriography was performed.   The PDA lesion had  recoiled.  Hence, it was decided to proceed with stenting of the PDA lesion.  Hence, a 2.5 x12 mm TAXUS stent was utilized, and the stent easily crossed  the lesion.  The stent was deployed at 16 atmospheres of pressure for 60  seconds.  The balloon was deflated, pulled back into the guiding catheter,  and arteriography was performed.  Excellent results were noted.  Intracoronary nitroglycerin was administered at multiple levels.  Then, the  guidewire was withdrawn out of the right coronary artery.  Arteriography was  performed.  After confirming the success, the guide was disengaged in the  right coronary artery, and pulled out of the body in the usual fashion.  The  patient tolerated the procedure well.  Because it was a super dominant right  coronary artery, during the procedure, it was decided to proceed with  initiation of Integrilin therapy along with Angiomax.                                              Cristy Hilts. Jacinto Halim, M.D.    Pilar Plate  D:  08/29/2002  T:  08/30/2002  Job:  [id number]   cc:   Dineen Kid. Reche Dixon, M.D.  [address]  [place] [address]  Fax: [phone]  [facility]

## 2010-10-11 NOTE — Consult Note (Signed)
[name], NETTLE NO.:  [id number]   MEDICAL RECORD NO.:  [id number]          PATIENT TYPE:  INP   LOCATION:  0376                         FACILITY:  [facility]   PHYSICIAN:  Lorre Munroe., M.D.DATE OF BIRTH:  March 29, 1928   DATE OF CONSULTATION:  03/06/2004  DATE OF DISCHARGE:                                   CONSULTATION   CHIEF COMPLAINT:  Not feeling well.   PRESENT ILLNESS:  This is a 77 year old man who is known to have gallstones  who had a rather severe episode of epigastric pain actually while undergoing  a stress test on Friday. It was not completed because he felt too ill. He  has had some fever. The pain has resolved. He is admitted to the hospital  for evaluation of fever and general failure to thrive. He is not having pain  at this time. However, a gallbladder ultrasound shows a slightly thickened  gallbladder wall, 4.4 mm, and a lot of gallstones and a somewhat distended  gallbladder. Murphy's sign was not found to be present at the time of  ultrasound. He was noted to have a bilirubin of 2.5, but the transaminases  and alkaline phosphatase were normal. I have been asked to see the patient  to evaluate him and consider cholecystectomy.   PAST MEDICAL HISTORY:  He has a chronic cough. He has coronary artery  disease with a history of unstable angina. He has a history of colon polyps.  He has a 4 cm abdominal aortic aneurysm. He has a history of obstructive  sleep apnea, hypertension, and hyperlipidemia. No known liver disease. He  takes Plavix, Crestor, aspirin, and Toprol.   FAMILY HISTORY AND CHILDHOOD ILLNESSES:  Unremarkable.   PAST SURGICAL HISTORY:  He has had cardiac catheterization and coronary  angioplasties.   The latest labs are total bilirubin are 2.5 and all the transaminases normal  as mentioned. White count 8.6, hemoglobin 13.8. There is a question of a  right-sided infiltrate on chest x-ray.   PHYSICAL EXAMINATION:  VITAL  SIGNS: Unremarkable as recorded by nurse.  GENERAL: In no acute distress. Answers questions appropriately.  HEENT/NECK: Unremarkable.  CHEST: Clear to auscultation.  HEART: Rate and rhythm unremarkable with no murmur or gallop noted.  ABDOMEN: Very slight right upper quadrant tenderness. The patient does not  even grimace when examined, but says it feels a little sore. No other  masses, tenderness, or organomegaly noted.  SKIN: Anicteric; no lesions noted.  LYMPH NODES: Not enlarged.  NEUROLOGIC: Grossly normal.   IMPRESSION:  1.  Symptomatic gallstones. I am pretty sure that the episode of pain he had      on Friday was biliary colic. I doubt that he has acute cholecystitis.  2.  Coronary artery disease, status not certain.   RECOMMENDATIONS:  1.  The patient will at some point need to have laparoscopic cholecystectomy      with cholangiogram. Since the sole abnormality of liver test is elevated      bilirubin I think unless the bilirubin rises he need not have      preoperative  ERCP.  2.  Full medical and cardiology evaluation should be carried out before the      patient has cholecystectomy unless he becomes more ill. I will follow      the patient along with you and will make arrangements to do surgery when      appropriate.      WB/MEDQ  D:  03/06/2004  T:  03/07/2004  Job:  [id number]   cc:   Mobolaji B. Corky Downs, M.D.  [address]  [place], [place] [address]  Fax: [phone]   [name], M.D.  [address]  [place]  [place] [address]  Fax: [phone]

## 2010-10-11 NOTE — Assessment & Plan Note (Signed)
[facility]                              CARDIOLOGY OFFICE NOTE   NAME:[name], [name]                       MRN:          [id number]  DATE:01/16/2006                            DOB:          09/10/1927    ADDENDUM:  We did a chest x-ray today and it showed a borderline heart size  and clear lung fields with no active disease.  We did an oxygen saturation,  it was 99%.  In view of this, I suspect that his cough and dyspnea may be  related to allergies.  We will give him a trial of Clarinex.  He indicated  he is scheduled to see Dr. [name] next week for his aneurysm repair.  We will  plan to see him back in follow up in a year.                               Bruce Elvera Lennox Juanda Chance, MD, [facility]    BRB/MedQ  DD:  01/16/2006  DT:  01/17/2006  Job #:  [id number]

## 2010-10-11 NOTE — Consult Note (Signed)
NAMEWHALEN, TROMPETER              ACCOUNT NO.:  [id number]   MEDICAL RECORD NO.:  [id number]          PATIENT TYPE:  INP   LOCATION:  0376                         FACILITY:  [facility]   PHYSICIAN:  Arturo Morton. Riley Kill, M.D. [facility] OF BIRTH:  November 09, 1927   DATE OF CONSULTATION:  DATE OF DISCHARGE:                                   CONSULTATION   CHIEF COMPLAINT:  Not feeling well.   HISTORY OF PRESENT ILLNESS:  The patient is a 77 year old gentleman who has  known gallstones. He was admitted with fever and what sounds like poor  appetite that dwindles.  He has had a thickened gallbladder suggestive of  chronic cholecystitis. There was also a question of an early infiltrate and  recently was seen in the office apparently thought to have a viral type  syndrome. He was subsequently admitted with antibiotics, he has settled  down. He denies any ongoing chest pain. In April of 2004, he underwent  catheterization by Cristy Hilts. Jacinto Halim, MD with an ejection fraction of 60%.  He  had scattered irregularities in the left coronary system and high grade  disease of the right which was successfully stented with __________ drug  eluding stents. The PDA was opened with a 2.5 mm stint and mid right  coronary with a 3.5 mm stent. He has done well since that time. He has not  been having significant ongoing chest pain. Preoperative consultation was  felt to be indicated.  He saw [name] about a week ago and a Cardiolite  was scheduled. HE was unable to complete that because he did not feel well  although I do not have any of these details. At the time of his  catheterization, he was also noted to have a moderately large abdominal  aortic aneurysm.  At the present time, he is feeling better and wants to go  home and complete his further evaluation.   PAST MEDICAL HISTORY:  1.  Coronary artery disease as noted above with prior PCI and stenting using      a drug eluding stent 404.  2.  Abdominal aortic  aneurysm.  3.  Hypertension.  4.  Hyperlipidemia.  5.  History of chronic polyps.  6.  History of obstructive sleep apnea.  7.  Probable urinary tract infection on this admission.   HOME MEDICATIONS:  1.  Toprol XL 50 mg daily.  2.  Aspirin.  3.  Crestor, question dose.  4.  Plavix.   FAMILY HISTORY:  His mother died at age 62 with heart failure and father  died at 79 with a CVA.   SOCIAL HISTORY:  The patient stopped smoking cigarettes about 11 years ago.   REVIEW OF SYMPTOMS:  Please see admission history and present illness.   LABORATORY DATA:  BUN 23, creatinine 1.4, glucose 128, white count 12.3,  hemoglobin 16.4, hematocrit 47.8.  Myoglobin was positive but CK-MB was 1.  Urine was positive for nitrates and large for bilirubin. There were some  grains in her casts.  SGOT and PT were normal, bilirubin was elevated at  2.5.   IMPRESSION:  1.  Cholecystitis, question extent and timing.  2.  History of prior coronary disease with previous drug eluding stent      implantation 404.   RECOMMENDATIONS:  At the present time, the biggest risk of the patient is  withdrawing and holding of his beta blockers, these should be restarted.  Secondly, he would like be safe if necessary for surgery. The patient wants  to be discharged and have his outpatient Cardiolite done. I would not  necessarily disagree with this although the timing of surgery has to be  determined by the surgeons. He should not be at excessive risk, I would  probably continue him on his aspirin but it would reasonable to discontinue  his Plavix at the present time for the surgery itself. With regard to the  Toprol, we will restart the Toprol XL at 15 mg daily. We will get that  information from the office as to prior workup.      TDS/MEDQ  D:  03/07/2004  T:  03/07/2004  Job:  [id number]   cc:   Charlies Constable, M.D. [facility]   [name], M.D.  [address]  [place]  [place] [address]  Fax: [phone]   Lorre Munroe., M.D.  Fax: [phone]

## 2010-10-11 NOTE — Assessment & Plan Note (Signed)
[facility]                              CARDIOLOGY OFFICE NOTE   NAME:VANHOYAndros, Channing                       MRN:          [id number]  DATE:01/16/2006                            DOB:          07-Aug-1927    PRIMARY CARE PHYSICIAN:  [name], MD   CLINICAL HISTORY:  Mr. Grissinger returned for a followup visit early because of  symptoms of cough, weight gain and some chest pain.  Ward Givens talked to  him by phone, and ordered a stress Cardiolite and an echocardiogram, and he  returned today for followup.  He says that he has not had any further chest  pain.  He still has a cough, which has been present for a number of months,  and which is mostly nonproductive.  He has also had some head congestion.  He says he has some shortness of breath with exertion.   Mr. Bogosian has documented coronary disease, and had stents placed in the  right coronary artery by [name], and then subsequently had a stent of the  mid right coronary artery for in-stent restenosis and a stent of the LAD by  myself with Cypher stents.  He has a normal left ventricular function.   We did an echocardiogram, which showed normal function and no major valvular  abnormalities, and we did a Myoview scan which showed some diaphragmatic  attenuation and possible mild redistribution, although this was very  questionable.   PAST MEDICAL HISTORY:  Significant for hypertension and hyperlipidemia, and  a 4.5 cm abdominal aneurysm.   CURRENT MEDICATIONS:  Plavix, Norvasc, B12, aspirin, Toprol, Crestor and  Ambien.   PHYSICAL EXAMINATION:  VITAL SIGNS:  Blood pressure is 120/78, the pulse is  65 and regular.  NECK:  There was no venous distention.  The carotid pulses are full without  bruits.  CHEST:  Clear.  CARDIAC:  Rhythm was regular.  I can hear no murmurs or gallops.  ABDOMEN:  Soft without organomegaly.  EXTREMITIES:  Peripheral pulses are full, and there is no peripheral  edema.   IMPRESSION:  1. Cough and shortness of breath of uncertain etiology.  2. Coronary artery disease, status post multiple percutaneous      interventions as described above, with recent negative Cardiolite scan.  3. Normal left ventricular function.  4. Abdominal aneurysm 4.5 cm.  5. Hypertension.  6. Hyperlipidemia.   RECOMMENDATIONS:  I am not certain regarding the etiology of Mr. Capano  cough and shortness of breath.  He thinks it might be related to allergies.  He is not on any ACE inhibitor.  We will get a chest x-ray and an oxygen  saturation today.  If this is negative, then we will give him a trial of  Clarinex.  I will check and see if Dr. [name] is following his aneurysm.   ADDENDUM:  We did a chest x-ray today and it showed a borderline heart size  and clear lung fields with no active disease.  We did an oxygen saturation,  it was 99%.  In view of this, I suspect  that his cough and dyspnea may be  related to allergies.  We will give him a trial of Clarinex.  He indicated  he is scheduled to see Dr. [name] next week for his aneurysm repair.  We will  plan to see him back in follow up in a year.                                 Bruce Elvera Lennox Juanda Chance, MD, [facility]   ZOX#[id number]  BRB/MedQ  DD:  01/16/2006  DT:  01/17/2006  Job #:  [id number]

## 2010-10-11 NOTE — Assessment & Plan Note (Signed)
[facility]                            CARDIOLOGY OFFICE NOTE   NAME:Elenbaas, Azahel                       MRN:          [id number]  DATE:05/08/2006                            DOB:          November 15, 1927    PRIMARY CARE PHYSICIAN:  Ardeen Garland, MD   PAST MEDICAL HISTORY:  Mr. Brumbach is 77 years old and has coronary heart  disease and had had stents placed in the right coronary by [name] for  the subsequent PCI in the mid-right coronary artery for in-stent  restenosis and a stent in the LAD with a Cypher stent.  Both the right  coronary artery and the LAD were treated with Cypher stents.  He has  done quite well since that time.  He did have an episode of chest pain  requiring nitroglycerin about 1 month ago, but no recurrence.  He has no  shortness of breath or palpitations.  His past medical history is significant for:  1. Hypertension.  2. Hyperlipidemia.  3. He has a 4.5-cm abdominal aneurysm followed by Dr. [name].   CURRENT MEDICATIONS:  Include Plavix, Norvasc, aspirin, Toprol, Crestor,  and B-12.   EXAMINATION:  Blood pressure is 130/72 and the pulse 58 and regular.  There was no venous distention.  The carotid pulses were full without  bruits.  CHEST:  Clear.  CARDIO:  Rhythm was regular, had no murmurs or gallops.  ABDOMEN:  Soft without organomegaly.  Peripheral pulses were full and no peripheral edema.  I could not feel the abdominal aneurysm.  His EKG was normal.   IMPRESSION:  1. Coronary artery disease, status post multiple percutaneous      interventions as described above with drug relief since and now      stable.  2. Normal LV function.  3. A 4.5-cm abdominal aneurysm.  4. Hypertension.  5. Hyperlipidemia.   RECOMMENDATIONS:  I think Mr. Diebold is doing well.  We will get a  fasting lipid and liver profile since he has not had this done in some  time.  We will not make any other changed in his therapy.  I did  recommend  that he stay on Plavix long-term because of drug-eluting  stents in both the right and LAD.  I will plan to see him back in  followup in 1 year.    Bruce Elvera Lennox Juanda Chance, MD, [facility]  Electronically Signed   BRB/MedQ  DD: 05/08/2006  DT: 05/08/2006  Job #: [id number]

## 2010-10-11 NOTE — Op Note (Signed)
[name], [place]              ACCOUNT NO.:  [id number]   MEDICAL RECORD NO.:  [id number]          PATIENT TYPE:  INP   LOCATION:  0376                         FACILITY:  [name]   PHYSICIAN:  Leonie Man, M.D.   DATE OF BIRTH:  06-Oct-1927   DATE OF PROCEDURE:  03/12/2004  DATE OF DISCHARGE:                                 OPERATIVE REPORT   PREOPERATIVE DIAGNOSIS:  Cholecystitis.   POSTOPERATIVE DIAGNOSIS:  Acute cholecystitis.   PROCEDURE:  Laparoscopic cholecystectomy with intraoperative cholangiogram.   SURGEON:  Leonie Man, M.D.   ASSISTANT:  __________ .   ANESTHESIA:  General.   NOTE:  Patient is a 77 year old man admitted to the [facility] for  treatment of pneumonia.  He started to have epigastric pain.  Noted to have  a history of cholelithiasis.  He was treated for his pneumonia and brought  to the operating room now for a laparoscopic cholecystectomy.  The risks and  potential benefits of surgery have been fully discussed with the patient.  He understands these risks and gives consent to surgery.   PROCEDURE:  Following induction of satisfactory general anesthesia with the  patient positioned supinely, the abdomen is prepped and draped, to be  included in the sterile operative field.  Open laparoscopy was created at  the umbilicus with insertion of a Hasson cannula  and insufflation of the  peritoneal cavity to 14 mmHg pressure.  The visual exploration of the  abdomen showed a few adhesions in the pelvis.  There was a large phlegmon  over the gallbladder and the edge of the liver noted.  The gallbladder was  really quite friable and tore easily.  The large and small intestines did  appear to be abnormal.  Under direct vision epigastric and lateral ports  were placed, and the gallbladder was dissected free out of the phlegmon and  grasped and retracted cephalad.  Dissection was carried down over the  gallbladder wall down to the ampulla.  The  ampulla was dissected down to the  cystic duct, which was traced up to the cystic duct/gallbladder junction.  The cystic artery was also traced up to its entry into the gallbladder wall.  The cystic artery was doubly clipped and then transected.  The cystic duct  was clipped proximally and opened.  The cystic duct cholangiogram was  carried up by passing a Cook catheter into the abdomen and into the cystic  duct and through this, 1/2 strength Hypaque was injected into the biliary  system.  There was free flow of contrast into the duodenum with prompt  filling of the duodenum, right and left hepatic radicals.  There was no  evidence of filling defects.  The cystic duct catheter was then removed, and  the cystic duct triply clipped and transected.  The gallbladder was then  dissected free from the liver bed using electrocautery and maintaining  hemostasis throughout the course of dissection.  At the end section, several  stones had been extruded from the gallbladder, and these were thoroughly  irrigated and aspirated from the abdominal cavity.  Multiple aliquots of  saline were used to clean the right upper quadrant.  The liver bed was then  checked for hemostasis.  Additional bleeding points were treated with  electrocautery.  Then the liver bed was then packed with Surgicel for  additional hemostasis.  A 19 [place] Blake drain was placed in the right  upper quadrant and subhepatic space for additional drainage.  This was  brought out through a stab wound on the right upper quadrant abdominal wall.  Sponge, instrument, and sharp counts were then verified.  The trocar was  removed down to direct vision.  The abdomen was then closed in layers as  follows:  The umbilical incision was closed in two layers with 0 Vicryl and  4-0 Monocryl.  The epigastric and lateral flank  wounds were closed with 4-0 Monocryl sutures.  All wounds were reinforced  with Steri-Strips.  Sterile dressings applied.   Anesthetic was reversed.  Patient removed from the operating room to the recovery room in stable  condition.  He tolerated the procedure well.     Patr   PB/MEDQ  D:  03/12/2004  T:  03/12/2004  Job:  [id number]

## 2010-10-11 NOTE — Cardiovascular Report (Signed)
[name], STAMPER NO.:  [id number]   MEDICAL RECORD NO.:  [id number]          PATIENT TYPE:  INP   LOCATION:  4702                         FACILITY:  [facility]   PHYSICIAN:  Vida Roller, M.D.   DATE OF BIRTH:  [birth year]   DATE OF PROCEDURE:  DATE OF DISCHARGE:                              CARDIAC CATHETERIZATION   PRIMARY:  [name], M.D.   CARDIOLOGIST:  Charlies Constable, M.D.   HISTORY OF PRESENT ILLNESS:  [name] is a 77 year old man with known  coronary artery disease, status post percutaneous revascularization of his  right coronary artery in 2004.  He now comes in with chest discomfort  concerning for unstable angina.  He was brought to the cardiac  catheterization.   DESCRIPTION OF PROCEDURE:  After obtaining informed consent, the patient was  brought to the cardiac catheterization laboratory in the fasting state.  There he was prepped and draped in the usual sterile manner.  Local  anesthetic was obtained over the right groin using 1% lidocaine with  epinephrine.  The right femoral artery was cannulated using the modified  Seldinger technique with a 6 French 10 cm sheath and left heart  catheterization was performed using a 6 French Judkins left #4, a 6 French  Judkins right #4 and a 6 French pigtail catheter.  The pigtail catheter was  used for left ventriculography which was imaged in the 30 degree RAO view.  At the conclusion of the procedure, the catheters were removed.  The patient  was moved back to the cardiology holding area where the femoral artery  sheath was removed.  Hemostasis was obtained using direct manual pressure.  At the conclusion of the hold, there was no evidence of ecchymosis or  hematoma formation.  Distal pulses were intact.  Total fluoroscopic time 3.6  minutes.  Total ionized contrast 100 mL.   RESULTS:  Aortic pressure 107/63 with a mean of 82 mmHg.   Left ventricular pressure 103/3 with an end-diastolic  pressure of 10 mmHg.   CORONARY ANGIOGRAPHY:  The left main coronary artery is a normal caliber  vessel with a 25% stenosis in its mid portion.   The left anterior descending coronary artery is a large vessel which has two  large diagonal branches.  There is a 75% stenosis in the mid portion of the  LAD after the second diagonal.   The circumflex coronary artery is a small dominant vessel with really  minimal circulation.   The right coronary artery is a large dominant vessel with stents in the mid  portion, as well as in the posterior descending and proximal portion of the  artery.  There is a 75% lesion in the area between the two stents in the  proximal and mid portions of the right coronary artery.  The posterior  descending artery is a large artery which is nonobstructive disease.   The left ventriculogram reveals normal left ventricular systolic function at  60% with no wall motion abnormalities and no mitral regurgitation.   My plan then is to refer this patient for percutaneous revascularization.  Will  give him a Plavix load.  Will schedule him for tomorrow.      Trey Paula   JH/MEDQ  D:  06/03/2004  T:  06/03/2004  Job:  [id number]

## 2010-10-11 NOTE — Op Note (Signed)
[name], BONZO NO.:  [id number]   MEDICAL RECORD NO.:  [id number]          PATIENT TYPE:  INP   LOCATION:  4702                         FACILITY:  [facility]   PHYSICIAN:  Charlies Constable, M.D. [facility] DATE OF BIRTH:  20-Aug-1927   DATE OF PROCEDURE:  06/04/2004  DATE OF DISCHARGE:                                 OPERATIVE REPORT   PROCEDURE:  Percutaneous coronary intervention note.   PREOPERATIVE CLINICAL HISTORY:  Mr. Hevia is 77 years old and has had  previous tandem overlying Taxus stents placed in the right coronary artery  in April of 2004 by [name].  These were 3.5 x 32-mm Taxus stents.  He has  done well since that time but recently was admitted with unstable angina and  was studied yesterday by Dr. Dorethea Clan and found to have 80% focal in-stent  restenosis within the stent in the right coronary artery and 80% stenosis in  the mid-left anterior descending artery.  He was scheduled for intervention  today.   DESCRIPTION OF PROCEDURE:  The procedure was performed via the right femoral  artery through an arterial sheath and a 6-French preformed coronary  catheters.  The patient had been on Plavix since admission to the hospital  and chronically.  He was given an Angiomax bolus and infusion.   We used a JR-4 6-French Guidant catheter with side holes and an Marketing executive.  We crossed the lesion in the mid-to-distal right coronary  artery with the wire without difficulty.  We predilated with a 3.5 x 10-mm  cutting balloon performing two inflations up to 10 atmospheres for 30  seconds.  We then deployed a 3.5 x 18-mm Cypher stent deploying this with  one inflation of 16 atmospheres for 30 seconds.  We then post-dilated with a  4.0 x 15-mm Quantum Maverick performing two inflations up to 16 atmospheres  for 30 seconds.   We then approached the lesion in the mid-left anterior descending artery.  We used a Q4 6-French Guidant catheter with side holes and the  same [employer] wire.  We crossed the lesion with the wire without difficulty.  We  direct stented with a 2.5 x 13-mm Cypher stent deploying this with one  inflation of 14 atmospheres for 30 seconds.  We then post-dilated with a  2.75 x 8-mm Quantum Maverick performing two inflations up to 16 atmospheres  for 30 seconds.  Repeat diagnostic studies were then performed through the  Guidant catheter.  The patient tolerated the procedure well and left the  laboratory in satisfactory condition.   RESULTS:  Initially, the stenosis in the mid-right coronary artery within  the second stent was estimated at 80%.  Following stenting, this improved to  0%.   The stenosis in the mid-left anterior descending artery was initially  estimated at 80%.  Following stenting, this improved to 0%.   CONCLUSION:  1.  Successful percutaneous coronary intervention for focal in-stent      restenosis in the mid-to-distal right coronary artery using  a Cypher      drug-alluding stent with improvement  of narrowing from 80% to 0%.  2.  Successful stenting of the lesion in the mid-left anterior descending      using a Cypher drug-alluding stent with improvement of narrowing from      80% to 0%.   DISPOSITION:  The patient was returned to the recovery unit for further  observation. Will recommend Plavix for one year.       BB/MEDQ  D:  06/04/2004  T:  06/04/2004  Job:  [id number]   cc:   [name], M.D.  [address]  [place]  [place] [address]  Fax: [phone]   Cardiopulmonary Laboratory

## 2010-10-11 NOTE — Discharge Summary (Signed)
[name], [name]              ACCOUNT NO.:  [id number]   MEDICAL RECORD NO.:  [id number]          PATIENT TYPE:  INP   LOCATION:  0376                         [facility]AC[facility]   PHYSICIAN:  Michaelyn Barter, M.D. DATE OF BIRTH:  10-16-1927   DATE OF ADMISSION:  03/05/2004  DATE OF DISCHARGE:  03/16/2004                                 DISCHARGE SUMMARY   FINAL DIAGNOSES:  1.  Pneumonia.  2.  Constipation.  3.  Dehydration.  4.  Urinary tract infection.  5.  Cholecystitis.   CHIEF COMPLAINT:  [name] arrived at the hospital emergency room with a  chief complaint of fever and shortness of breath.   HISTORY OF PRESENT ILLNESS:  [name] is a 77 year old gentleman with a  past medical history of unstable angina, coronary artery disease, a large  abdominal aortic aneurysm, hypertension, hyperlipidemia, and obstructive  sleep apnea. He stated that the Friday prior to this admission he developed  fever and shortness of breath. Said that he was supposed to have a stress  test completed at that time. However, while at his doctor's office, he was  too sick to complete it. He stated that he had some upper abdominal pain,  which prevented the stress test from being completed. Two days later, on  Sunday, he felt weaker and developed a fever of 101.3. He said that he  called his primary care doctor, Dr. Lysbeth Galas, and was told that he probably  had a virus. According to the patient, he was instructed to take Tylenol. On  the day of admission, he states that at approximately 12:30 p.m., he  experienced some jerking activity. During this activity, he could not  breathe and his temperature, according to himself and his wife, was 103  axillary. Following his arrival to [facility], he was  found to be afebrile. Both he and his wife states that that jerking activity  lasted for approximately 20 to 30 minutes. During that activity, he denied  the loss of urine or any bowel  incontinence. He states that his baseline  includes some shortness of breath, especially with exertion. At the time of  admission, he denied having any chest pain. He had some abdominal  discomfort, which he stated again had developed over the course of a few  days. The discomfort did not travel to his back or anywhere else.   ALLERGIES:  No known drug allergies.   PAST MEDICAL HISTORY:  1.  Unstable angina.  2.  Coronary artery disease.  3.  Large abdominal aortic aneurysm.  4.  Hypertension.  5.  Hyperlipidemia.  6.  Questionable abnormal liver.  7.  History of colonic polyps.  8.  Obstructive sleep apnea.   ADMISSION MEDICATIONS:  1.  Plavix.  2.  Crestor.  3.  Aspirin.  4.  Toprol.   PAST SURGICAL HISTORY:  The patient underwent a cardiac catheterization on  August 29, 2003 by Dr. Yates Decamp. The final impressions were:  1.  Coronary angioplasty and stenting of the patent ductus arteriosus branch      with 2.5 x  12 mm stent, reducing stenosis of 95% to 0%.  2.  Angioplasty and stenting of the mid and proximal right coronary artery      with a 3.5 x 32 mm stent.   FAMILY HISTORY:  Mother died at the age of 72 with congestive heart failure.  Father died at the age of 29 with cerebrovascular accident.   SOCIAL HISTORY:  Positive cigarette history. The patient stopped  approximately 11 years ago. Started cigarettes at the age of 60, less than 1  pack per day. The patient denies alcohol.   HOSPITAL COURSE:  While in the emergency room the patient had a chest x-ray,  the results of which revealed streaky bibasilar atelectasis. The radiologist  could not exclude early infiltrate, particularly on the right side.  Therefore, the patient was treated with Ceftriaxone 1 gram IV piggyback q.  24 hours and Azithromycin 500 mg IV piggyback q. 24 hours for pneumonia. He  was also provided incentive spirometer for the bibasilar atelectasis that he  presented with. The patient also had an  abdominal ultrasound completed, the  results of which revealed gallstones with a distended gallbladder and mild  gallbladder wall thickening. The gallbladder wall measures 4.4 mm with a  negative ultrasound. No graphic Murphy's sign and it also revealed the  presence of his abdominal aortic aneurysm, which was 4.0 cm in size.  Following the results of this, surgery was consulted. Their impression was  that the patient was having symptomatic gallstones and that they were pretty  sure that the episode of pain that he had experienced the Friday prior to  his admission was biliary colic. They doubted that he had acute  cholecystitis, however, and they had planned that the patient would have a  laparoscopic cholecystectomy with cholangiogram at some point. However,  because the patient did have a history of heart disease, cardiology needed  to be consulted for further evaluation. Therefore, cardiology was consulted  and Dr. Shawnie Pons responded. His recommendation were that the patient  would likely be safe if necessary for surgery. The cardiologist's note  stated that the patient wanted to be discharged and have his outpatient  Cardiolite done, however, and the cardiologist did not disagree with that  decision. He stated that if that were to occur, the patient should not be at  excessive risk. It was recommended that the patient should continue his  aspirin but it would be reasonable to discontinue his Plavix at the time for  surgery his self. Likewise, gastroenterology was also consulted regarding  the patient abdominal pain and they suspected that the patient had  cholecystitis and also the laparoscopic cholecystectomy. On March 12, 2004, the patient underwent a laparoscopic cholecystectomy. His surgery  appeared to be uneventful, however, over the course of the patient's  hospitalization, he appeared to recovery well from the surgery. To address the patient's complaint of shortness of  breath, which he had upon admission  into the hospital, the patient had a chest CT angiography with contrast  completed on March 07, 2004 secondary to his complaint of shortness of  breath. This was done to rule out the possibility or the presence of  pulmonary embolism. The final conclusion of the chest CT angiography with  contrast was that there was no CT evidence of acute pulmonary embolism.  There was acute cholecystitis and bibasilar atelectasis. This test was again  completed on March 07, 2004. Over the course of the patient's  hospitalization, however, he did complain  of some shortness of breath on and  off throughout the course of the day. On March 09, 2004, a chest x-ray was  completed, the results of which revealed cardiomegaly and bibasilar  atelectasis/scarring. Likewise, the night following his laparoscopic  cholecystectomy, he complained of some shortness of breath and had a repeat  chest x-ray completed which revealed postoperative bibasilar atelectasis  with very low lung volumes. At this time, the patient was encouraged to  utilize his incentive spirometer more often. He was continued on  supplemental oxygen and nebulized breathing treatments were initiated with  albuterol and Atrovent. The days following that, the patient stated that his  breathing at times improved and at other times, he felt short of breath.  However, on the day of discharge the patient stated that he felt as though  he was at his baseline. Another complaint of the patient's, following his  surgery, was that he had some difficulties urinating. Therefore, a decision  was made to place a Foley catheter within the patient and at that time, the  patient did put out a significant amount of residual urine. Subsequently, I  ordered a prostate specific antigen test, which was normal at 1.43. By the  time the patient had been discharged, his Foley catheter had been removed  and he stated that he was able  to void with no problems. Again, over the  course of his hospitalization, he did complain of some shortness of breath  but he also stated that his baseline was to have some shortness of breath. A  BNP was also obtained to rule out congestive heart failure as a possibility.  The result of that was reported to be 35.7, which is completely normal and  this was completed on March 14, 2004. The patient did have a D-dimer  completed on March 15, 2004, which was slightly elevated at 2.02. However,  again a chest CT scan with contrast was completed to rule out  pulmonary  embolism as a possibility and again, the results of the chest CT with  contrast was negative for pulmonary embolism. It only displayed the presence  of bibasilar atelectasis.   CONDITION ON DISCHARGE:  The patient stated that he felt much better at the  time of discharge and he stated that he wanted to go home today. He said that he had been walking around more frequently. I also asked the nurses if  they would remove his oxygen and have him ambulate. I had the patient  ambulate around the unit several times and I asked that his O2 saturations  be checked without oxygen. This was completed and the patient's O2  saturations were in the middle 90's, that is 94% and 95%. Therefore, the  decision was made to discharge the patient home.   DISCHARGE MEDICATIONS:  1.  Plavix 75 mg 1 tablet p.o. q.d.  2.  Atrovent 0.5 mg MDI 2 puffs q. 6 hours.  3.  Toprol XL 50 mg 1 tablet q.d.  4.  Protonix 40 mg 1 tablet q.d.  5.  Albuterol 2.5 mg MDI 2 puffs q. 6 hours p.r.n.  6.  Norvasc 5 mg 1 tablet p.o. q.d.  7.  Aspirin 81 mg 1 tablet p.o. q.d.   DIET:  The patient is instructed to consume a low-salt diet.   FOLLOW UP:  He is instructed to take all of his medications as prescribed  and to see his primary care doctor for followup evaluation within 1 to 2  weeks.     Laymond Purser   OR/MEDQ  D:  03/16/2004  T:  03/16/2004  Job:  [id number]    cc:   [name], M.D.  [address]  [place]  [place] [address]  Fax: [phone]   Lorre Munroe., M.D.  Fax: [phone]   Arturo Morton. Riley Kill, M.D. [facility]

## 2010-10-11 NOTE — H&P (Signed)
[name], [name] NO.:  [id number]   MEDICAL RECORD NO.:  [id number]          PATIENT TYPE:  INP   LOCATION:  1827                         FACILITY:  [facility]   PHYSICIAN:  Arvilla Meres, M.D. LHCDATE OF BIRTH:  Nov 09, 1927   DATE OF ADMISSION:  06/01/2004  DATE OF DISCHARGE:                                HISTORY & PHYSICAL   CHIEF COMPLAINT:  Chest pain.   PRIMARY CARE PHYSICIAN:  Joette Catching, M.D.   PRIMARY CARDIOLOGIST:  Charlies Constable, M.D.   HISTORY OF PRESENT ILLNESS:  [name] is a 77 year old male with a history  of coronary artery disease.  He had some elevated enzymes when he was in the  hospital with pneumonia and cholecystitis, for which he ended up getting a  laparoscopic cholecystectomy.  He had a Cardiolite after that, and it showed  no ischemia, but was positive for scar and an EF of 57%.  He was seen in the  office after that admission by [name] and managed medically.  On the day  prior to admission, he had chest pain and shortness of breath that started  at approximately 9 a.m.  It waxed and waned and lasted all day.  It was 5/10  at its worst and was described as a soreness in his left chest.  He stated  that the symptoms were worse when he would walk up steps.  He took a  sublingual nitroglycerin the past p.m. finally, which decreased his pain.  He did manage to sleep.  He had pain this a.m. again upon awakening, and  came by EMS to the emergency room.  EMS gave him four baby aspirin and three  sublingual nitroglycerin.  His symptoms resolved shortly after arriving in  the emergency room.  He was pain-free by 12:30.  He states that this is  different from his previous anginal symptoms.  He had associated shortness  of breath, but no nausea, vomiting, or diaphoresis.  He has never had this  kind of pain before.  He is pain-free at the time of exam.   PAST MEDICAL HISTORY:  1.  Cardiac catheterization in 2004, which showed an EF  of 60%.  Mid RCA had      80 and 95% stenoses with a PDA 95% stenosis.  Circumflex had luminal      irregularities, and the LAD had proximal 40% and mid 20% stenoses.  He      had three Taxus stents placed to the RCA and PDA.  2.  History of abdominal aortic aneurysm, 4.0 cm by ultrasound in October      2005.  3.  History of obstructive sleep apnea.  4.  History of pneumonia.  5.  History of an elevated D-dimer with a negative CT.  6.  Possible history of mini-strokes, for which his family states that he      is being evaluated by neurology.   PAST SURGICAL HISTORY:  Catheterizations, laparoscopic cholecystectomy,  hernial repair.   ALLERGIES:  No known drug allergies.   MEDICATIONS:  1.  Crestor 10 mg daily.  2.  Protonix 40 mg daily.  3.  Plavix 75 mg daily.  4.  Norvasc 5 mg daily.  5.  Sublingual nitroglycerin p.r.n.  6.  Aspirin 81 mg daily.  7.  Toprol-XL, unclear dosage, thought to be 25 mg a day.   SOCIAL HISTORY:  He lives in [place] with his wife, and is retired from  [employer].  He quit tobacco in 1994, and did not use it heavily  prior to that.  He denies alcohol or drugs.   FAMILY HISTORY:  His mother died at age 44 with CHF.  His father died at age  88 of a stroke.  His twin sister has had bypass surgery.   REVIEW OF SYMPTOMS:  Significant for blindness in his right eye.  Chest pain  and shortness of breath as described above.  He has had a recent  nonproductive cough, but no fever.  He has some chronic dyspnea on exertion  that has not changed recently.  He admits to confusion.  His wife states  that he has had some personality changes recently.  He has had constipation  but denies any other GI symptoms.  Review of systems is otherwise negative.   PHYSICAL EXAMINATION:  VITAL SIGNS:  Temperature 97.1, blood pressure  110/69, heart rate 98, respiratory rate 20.  GENERAL:  He is a well-developed, elderly, white male, in no acute distress.  HEENT:   Head is normocephalic and atraumatic.  Pupils are equal, round and  reactive to light and accommodation.  Extraocular movements intact.  Sclerae  clear.  Nose without discharge.  NECK:  There is no lymphadenopathy, thyromegaly, bruit, or JVD noted.  CARDIOVASCULAR:  His heart is regular in rate and rhythm with a S1 and S2.  No significant murmur, rub, or gallop are noted.  LUNGS:  Clear to auscultation bilaterally, but has decreased breath sounds  at the bases.  SKIN:  No rashes or lesions noted.  ABDOMEN:  Soft, nontender, active bowel sounds, no hepatosplenomegaly noted.  EXTREMITIES:  No cyanosis, clubbing or edema.  DP pulses are 1+ bilaterally.  No femoral bruits are appreciated.  MUSCULOSKELETAL:  No joint deformity or effusions.  No spine or CVA  tenderness.  NEUROLOGICAL:  He is alert and oriented.  Cranial nerves II-XII are grossly  intact.   LABS/STUDIES:  Chest x-ray: Right lower lobe atelectasis versus scar, low  volumes.   EKG: Rate 97, sinus rhythm, with no acute ischemic changes.   ASSESSMENT/PLAN:  1.  [name] has a two-day history of progressive chest pain that is worse      with exertion.  He has no associated symptoms except for shortness of      breath.  His previous angina was mostly shortness of breath, so this is      a bit atypical for him, but, given the progression of symptoms and      relief with nitroglycerin, we will admit, rule out myocardial      infarction, add heparin and IV nitroglycerin, and catheterization on      Monday, unless symptoms should change acutely.  2.  History of abdominal aortic aneurysm, 4.0 cm by ultrasound in October      2005.  3.  Hyperlipidemia per Dr. Regino Schultze note on May 08, 2004, recheck lipid      and liver tests in one month.  We will order that as an inpatient.  4.  He is otherwise stable.  See past medical history.  This is [employer]  Barrett, P.A.-C dictating for Dr. Nicholes Mango, who saw the  patient and  determined the plan of care.      Rhon   RB/MEDQ  D:  06/01/2004  T:  06/01/2004  Job:  [id number]

## 2010-10-11 NOTE — Discharge Summary (Signed)
[name], [name]                          ACCOUNT NO.:  [id number]   MEDICAL RECORD NO.:  [id number]                   PATIENT TYPE:  INP   LOCATION:  6533                                 FACILITY:  [facility]   PHYSICIAN:  Cristy Hilts. Jacinto Halim, M.D.                  DATE OF BIRTH:  Dec 24, 1927   DATE OF ADMISSION:  08/27/2002  DATE OF DISCHARGE:  08/30/2002                                 DISCHARGE SUMMARY   DISCHARGE DIAGNOSES:  1. Unstable angina, resolved, negative myocardial infarction.  2. Coronary artery disease with percutaneous transluminal coronary     angiography and stent to the right coronary artery, distal end proximal,     total of three stents, Taxus drug-alluding stents.  3. Residual coronary disease of 20-40% in the left anterior descending and     diagonal-1.  4. Abdominal aortic aneurysm.  5. Hypertension.  6. Hyperlipidemia.  7. Abnormal liver per patient.   DISCHARGE MEDICATIONS:  1. Toprol XL 25 mg take 1/2 tablet to equal 12.5 mg once a day.  2. Uniretic 7.5/12.5 one daily.  3. Plavix 75 mg one daily x1 year.  4. Ambien 5 mg one at bedtime as needed.  5. Enteric coated aspirin 81 mg daily.  6. Nitroglycerin 1/150 under tongue for chest pain.  7. Ask primary physician for a Statin drug if possible.  This would be very     important with your coronary artery disease.   SPECIAL INSTRUCTIONS:  Nitroglycerin for chest pain.   ACTIVITY:  As tolerated.  No lifting over 10 pounds, no sexual activity and  no strenuous activity x3 days.   DIET:  Low fat, low salt diet.   WOUND CARE:  Wash catheterization site with soap and water.  Call with any  bleeding, swelling or drainage.   FOLLOW UP:  See [name] on September 15, 2002, at 9:30 a.m. at [facility].  See Dr. Liliane Bade in six months.  Call office  for an  appointment.  See primary care physician some time in the next couple of  weeks.  Cardiac rehabilitation as instructed.   HISTORY OF  PRESENT ILLNESS:  This is a 77 year old, white, married male with  past history of hypertension who presented to the ER on August 27, 2002, with  chest pain.  Dr. Benjaman Lobe teaching service admitted him and asked for  cardiology consult.  [name] saw the patient in consult.  Over the  previous two to three weeks, he had increased dyspnea on exertion and  occasional chest pressure.  With mowing the grass on Thursday, he developed  shortness of breath and heaviness in his chest.  On August 27, 2002, he was  digging a hole with a post hole digger, became short of breath with  significant chest tightness, no nausea or diaphoresis.  This was more  significant than Thursday.  He called  EMS and came to the hospital.  On  exam, he had no further chest pain.  The patient does relate his dyspnea on  exertion with two to three months.   PAST MEDICAL HISTORY:  1. Hypertension.  2. Colonoscopy with 10 polyps removed by Dr. Jarold Motto.  3. He had some type of liver problem that he has had in the past.   ALLERGIES:  No known drug allergies.   MEDICATIONS:  1. Aspirin 81 mg daily.  2. Uniretic 7.5/12.5 daily.   FAMILY HISTORY:  Mother died at age 18 with heart failure.  Father died at  47 with CVA.   SOCIAL HISTORY:  He is married.  He stopped tobacco 10 years ago.   REVIEW OF SYMPTOMS:  See HPI.   DISCHARGE PHYSICAL EXAMINATION:  VITAL SIGNS:  Blood pressure 120/84, pulse  64, respirations 20, temperature 96.2.  GENERAL:  Alert and oriented white male in no acute distress.  HEART:  S1, S2, regular rate and rhythm.  LUNGS:  Clear without rales, rhonchi or wheezes.  ABDOMEN:  Soft, nontender, positive bowel sounds.  EXTREMITIES:  Right groin catheterization site without hematoma.  No lower  extremity edema and 2+ pedal pulses.   LABORATORY DATA AND X-RAY FINDINGS:  Hemoglobin 16.1, hematocrit 45.8, WBC  5.9, MCV 86.3, platelets 156, neutrophils 72, lymphs 19, monos 6, eos 3,  baso 1.  PT 13.5,  INR 1.  Sodium 139, potassium 3.9, chloride 107, CO2 26,  glucose 103, BUN 13, creatinine 1.2, calcium 9.0.  These remain stable.  CKs  ranged 88, 92, 101; MBs 2.7 to 2.1; troponin all negative at 0.01 to 0.02.  Lipids with total cholesterol 202, triglycerides 149, HDL 34 and LDL 138.  Prior to discharge, his potassium was 3.5.  He was given 20 mEq of potassium  prior to discharge.  Hemoglobin was stable.  Total bilirubin was slightly up  at 3.5.  LFTs were within normal limits.   Chest x-ray on admission showed some scarring.  EKG on admission in sinus  rhythm, ST abnormality, possible digoxin effect.  Followup on April 5,  showed no acute changes, maybe some ST elevation, probably early  repolarization.  Today, no acute changes at all.   HOSPITAL COURSE:  The patient was admitted on August 27, 2002, by Dr. Benjaman Lobe  teaching service for chest pain and shortness of breath.  He was placed on  the telemetry unit and cardiac enzymes were done.  He was placed on Lovenox.  No further chest pain after admission.  He was evaluated by [name] and  plans were made for cardiac catheterization.   On August 29, 2002, cardiac catheterization revealed 95% stenosis, mid RCA 80%  proximal and 95% distal.  He had Taxus stents placed in three sites reducing  stenosis to 0%.  He had residual disease in diagonal-1 of 40% and LAD 20%.  EF was 60%.  He was also found to have an abdominal aortic aneurysm.  Dr.  [name] had been called for consultation concerning abdominal aneurysm.  On  ultrasound, it was 3.3 cm and would be followed by Dr. [name] as outpatient  in six months.  Dr. [name] will see the patient prior to his discharge home.  On August 30, 2002, the patient was ambulating without problems and felt he  was ready for discharge.  He will follow up with [name], Dr. [name] and  his primary care physician.    Darcella Gasman. [place], [place].P.  Cristy Hilts. Jacinto Halim, M.D.    LRI/MEDQ  D:  08/30/2002  T:   08/31/2002  Job:  [id number]   cc:   Cristy Hilts. Jacinto Halim, M.D.  [address]  [place]  [place] [address]  Fax: [phone]   [place]  [address]  [place] [address]  Fax: [phone]   P. Liliane Bade, M.D.  [address]  [place]  [place] [address]  Fax: [phone]

## 2010-10-11 NOTE — Assessment & Plan Note (Signed)
[facility]                            CARDIOLOGY OFFICE NOTE   NAME:[name], [name]                       MRN:          [id number]  DATE:09/29/2006                            DOB:          1928/02/18    PRIMARY CARE PHYSICIAN:  [name], M.D. in [place].   PRIMARY CARDIOLOGIST:  Everardo Beals. Juanda Chance, MD, [facility].   PATIENT PROFILE:  A 77 year old Caucasian male with a prior history of  CAD who presents with a one day history of mild dyspnea.   PROBLEM LIST:  1. CAD.      a.     Status post PCI and stenting in the RCA with three Taxus       drug-eluting stents by [name].      b.     Status post PCI and stenting of the RCA for end-stage       restenosis with a Cypher drug-eluting stent as well as placement       of a Cypher drug-eluting stent in the LAD, both performed by Dr.       Juanda Chance.      c.     In August, 2007, an Adenosine Myoview showing diaphoretic       attenuation and possible mild redistribution, although this was       questionable.  Echocardiogram performed at the same time showed       normal LV function without any major valvular abnormalities.  2. Hypertension.  3. Hyperlipidemia.  4. A 4.5 cm abdominal aneurysm followed by Dr. [name].  5. Insomnia.  6. Seasonal allergies.   HISTORY OF PRESENT ILLNESS:  A 77 year old married Caucasian male with  the above problem list who last saw [name] in December, 2007.  At  that time, he was doing well.  Yesterday, he was sitting on his couch  and felt as though he could not get a deep enough 1breath to satisfy  himself, or he is not able to describe whether or not he was actually  dyspneic.  He says he was not huffing and puffing and denies any chest  pain.  His wife says that she noted that he appeared to be somewhat  anxious and was sort of pacing but again, was not huffing and puffing.  He slept reasonably well last night and this morning felt okay and then  after breakfast  went out to water his flowers and while doing so, just  felt somewhat weak and mildly dyspneic.  Again, he did not have any  chest discomfort.   He does report somewhat of a chronic cough as well as nasal and sinus  congestion.  He had similar dyspnea in August, 2007, at which time the  stress Myoview as well as an echocardiogram were performed and are  unrevealing.  A chest x-ray was also performed at that time, which was  normal.  At that time, he was treated with Clarinex, and he had  symptomatic improvement.  He has since come off of the Clarinex.  He  denies any PND, dizziness, syncope, edema, or early satiety.  CURRENT MEDICATIONS:  1. Plavix 75 mg daily.  2. Crestor 10 mg daily.  3. Aspirin 81 mg daily.  4. Ambien 10 mg nightly.  5. Norvasc 5 mg daily.  6. Toprol XL 50 mg daily.   PHYSICAL EXAMINATION:  VITAL SIGNS:  Blood pressure 132/75, heart rate  52, respirations 16.  He is afebrile.  His weight is 212 pounds.  GENERAL:  He is a white male in no acute distress.  Awake, alert and  oriented x3.  NECK:  No bruits or JVD.  LUNGS:  Respirations are regular and unlabored.  Clear to auscultation.  CARDIAC:  Regular S1 and S2.  No S3, S4, or murmurs.  ABDOMEN:  Soft, nontender, nondistended.  Bowel sounds are present x4.  EXTREMITIES:  Warm and dry.  Pink.  No clubbing, cyanosis or edema.  Dorsalis pedis and posterior tibial pulses are 2+ bilaterally.   ACCESSORY CLINICAL FINDINGS:  EKG shows sinus bradycardia with early  repolarization.  There are no acute ST/T changes.   CBC, BMET, LFTs, TSH, and fasting lipids are pending.  The patient will  have those drawn at Dr. Joyce Copa office tomorrow.   ASSESSMENT/PLAN:  1. Dyspnea:  Question etiology.  The patient is not able to really      describe his symptoms aside from saying that he had some mild      dyspnea on watering the flowers today and that last night he felt      as though he could not get a deep enough breath to  feel full.  He      had a workup in August for similar symptoms, and that was      unrevealing.  At this point, we will check a CBC, BMET, TSH (he      reports some weakness).  We have also recommended that he try over-      the-counter Claritin to see if this helps, as this has helped in      the past.  We have recommended that if he have progressively      worsening symptoms, that he have a low threshold, to give [place] a call      back to reschedule an appointment sooner than one month, for which      he will be scheduled with [name].  2. Coronary artery disease:  His previous anginal equivalent was      actually chest pain.  He has not had any chest pain.  He remains on      aspirin, Plavix, beta blocker, and statin therapy.  3. Hypertension:  Blood pressure is adequately controlled.  Continue      beta blocker and calcium channel blocker.  4. Hyperlipidemia:  Patient is asked to have lipids checked.  I have      given him a prescription for lipids, and I will see if this can be      done at Dr. Joyce Copa office tomorrow, as he is not fasting today.      He remains on Crestor therapy.  5. Insomnia:  He takes Ambien.  6. History of abdominal aortic aneurysm:  He is followed by Dr. [name].   DISPOSITION:  Patient will have blood work tomorrow, which we have  requested be faxed to [place].  If he has progressive symptoms, he will  follow up sooner, otherwise we plan on seeing him back in approximately  one month with [name].      Nicolasa Ducking, ANP  Electronically Signed      Jonelle Sidle, MD  Electronically Signed   CB/MedQ  DD: 09/29/2006  DT: 09/30/2006  Job #: [id number]

## 2010-10-11 NOTE — Discharge Summary (Signed)
[name], [name] NO.:  [id number]   MEDICAL RECORD NO.:  [id number]          PATIENT TYPE:  INP   LOCATION:  6525                         FACILITY:  [facility]   PHYSICIAN:  Charlies Constable, M.D. [facility] DATE OF BIRTH:  December 10, 1927   DATE OF ADMISSION:  06/01/2004  DATE OF DISCHARGE:  06/05/2004                           DISCHARGE SUMMARY - REFERRING   BRIEF HISTORY:  [name] is a 77 year old male who on the day prior to  admission had some chest discomfort and shortness of breath which waxed and  waned throughout the day.  He gave it a 5 over a scale of 0-10 at its worst  and described it as a soreness in his left chest, worsening when he would  walk up steps.  Sublingual nitroglycerin that he finally took on the evening  decreased his discomfort and he did manage to sleep.  However, on the  morning of admission, he continued to have the chest discomfort and thus he  presented to the emergency room for further evaluation.  After receiving  baby aspirin and three sublingual nitroglycerin's, his discomfort resolved.  He feels that his symptoms are different from his prior anginal symptoms and  has never had this type of discomfort before.  His history is notable for  known coronary artery disease, last catheterization was in 2004 which showed  an ejection fraction of 60%, mid right coronary artery had 80-95% stenosis  and a PDA of 95%, luminal irregularities in the circumflex, proximal 40 and  the mid 20% left anterior descending.  At that time, he had three types of  stents placed to the right coronary artery and posterior descending artery.  He also has a history of known abdominal aortic aneurysm, last ultrasound in  October of 2005 which revealed it to be 4.0 cm, obstructive sleep apnea,  pneumonia, elevated D-dimer with negative CT scan, mini strokes being  evaluated by neurology, status post cholecystectomy, hyperlipidemia, remote  tobacco use.   LABORATORY DATA:   Chest x-ray on admission has showed improved aeration of  the left base.  Serial of three CK MB's and troponins were negative for  myocardial infarction.  Fasting lipids showed a total cholesterol of 122,  triglycerides 153, HDL 25, LDL 66.  Admission sodium was 139, potassium 4.0,  BUN 14, creatinine 1.1, glucose 94, normal liver function tests on June 02, 2004 except for a total bilirubin which was slightly elevated at 1.6.  Admission PT was 13.6, PTT 33, hemoglobin and hematocrit 13.9 and 40.8.  Normal indices.  Platelets 198,000, WBC 7.0.  Subsequent hematologies were  essentially unremarkable.  EKG showed sinus bradycardia, normal axis, early  repolarization, early R wave.   HOSPITAL COURSE:  [name] was admitted to the hospital by [name]  and [name].  Overnight, he did not have any further chest discomfort.  Enzymes and EKG's were negative for myocardial infarction.  [name],  given his symptoms and his history, felt that he should undergo cardiac  catheterization.  The patient agreed.  On January 09,. 2006, [name]  performed cardiac catheterization without  difficulty.  His ejection fraction  was 60% without wall motion abnormalities.  He had a 75% mid LAD.  The  stents in the RCA and the PDA were patent.  He did have a 75% mid RCA lesion  in between the proximal and mid stents.  [name] reviewed with [name]  and felt that he should undergo intervention to the LAD and RCA lesions.  He  was loaded with Plavix.  The following day, two Cipher stents were placed,  one in the mid RCA and one in the mid LAD without difficulty.  [name]  of [facility] assisted with discharge planning.  It was  noted that the patient has an abdominal CT scan scheduled at [facility] on June 13, 2004 at 8:30 a.m. scheduled by [name].  On June 05, 2004 after  evaluation by [name] it was felt that he could be discharged home.   DISCHARGE  DIAGNOSES:  1.  Unstable angina status post stenting of the mid right coronary artery      and mid left anterior descending without difficulty.  2.  History as previously.   DISPOSITION:  He is discharged home, asked to continue his home medications  which include:  1.  Crestor 10 mg daily.  2.  Protonix 40 daily.  3.  Plavix 75 daily.  4.  Norvasc 5 mg daily.  5.  Aspirin 81 daily.  6.  Toprol XL 50 mg daily.  7.  Nitroglycerin p.r.n.  8.  He was asked to increase his aspirin to 325 daily for one year.   He is advised no heavy lifting or driving for two days, maintain a low fat,  cholesterol diet, call the office if he has any problems with his  catheterization site.  He will have an abdominal CT scan at [facility] on  June 13, 2004 at 8:30, will obtain the contrast from the radiology  department at the time of discharge.  He was advised no food or drink after  midnight prior to his test.  He will follow up with [name] on Tuesday,  July 02, 2004 at 3:45 and he will follow up with [name] on  July 15, 2004 at 10 a.m. in regards to the abdominal CT scan.       EW/MEDQ  D:  06/05/2004  T:  06/05/2004  Job:  [id number]   cc:   [name], M.D.  [address]  [place]  [place] [address]  Fax: [phone]   Charlies Constable, M.D. [facility]

## 2010-10-11 NOTE — H&P (Signed)
[name], [name] NO.:  [id number]   MEDICAL RECORD NO.:  [id number]          PATIENT TYPE:  EMS   LOCATION:  ED                           FACILITY:  [facility]   PHYSICIAN:  Michaelyn Barter, M.D. DATE OF BIRTH:  24-Jan-1928   DATE OF ADMISSION:  03/05/2004  DATE OF DISCHARGE:                                HISTORY & PHYSICAL   PRIMARY CARE PHYSICIAN:  [name], M.D.   CHIEF COMPLAINT:  Fever, shortness of breath.   HISTORY OF PRESENT ILLNESS:  The patient is a 77 year old male with a past  medical history of coronary artery disease, abdominal aortic aneurysm,  hypertension, hyperlipidemia, and obstructive sleep apnea.  He states that,  last Friday, he developed fever and shortness of breath.  He said he was  supposed to have a stress test completed last Friday; however, he was too  sick while at his doctor's office; therefore, the test was not completed.  He states he had experienced some upper abdominal pain; therefore, he could  not have the stress test done.  This past Sunday, he went on to feel very  weak.  He stated he had a temperature of 101.3.  He called his primary care  physician, Dr. Lysbeth Galas, and was told that he probably had a virus.  He was  instructed to take some Tylenol.  Today, around 12:30 p.m., he was found to  be in a jerking motion.  He stated he could not breathe, and his temperature  was found to be 103 axillary.  After he arrived at [facility], however, he  was found to be afebrile.  According to the patient and his wife, the  episode of jerking activity lasted for approximately 20 to 30 minutes.  The  patient appeared to be gasping for air at that time.  The patient states  that, over the last five days, his energy level has been decreased to the  point that his appetite has also been diminished, and he has eaten very  little.  He states that he has some baseline shortness of breath especially  with exertion; however, he denies  nausea, no emesis.  He has not had a bowel  movement since last Thursday.  According to the patient, he had a chronic  cough for approximately one year.  There is no production of this cough.  He  is kind of vague when asked whether or not it has progressed acutely.  He  denies PND, no orthopnea.  No urinary incontinence.  No bowel incontinence  during the episode of jerking activity today.  No loss of consciousness,  either, during this activity.  He denies having any current shortness of  breath.  He said he had a light episode of shortness of breath earlier while  in the ER, however, but nothing like experienced earlier today at home.  He  denies chest pain currently, has some abdominal discomfort over the past  couple of days but not today, and the abdominal discomfort does not travel  nor does he complain of any abdominal pain that travels to the back.  ALLERGIES:  No known drug allergies.   PAST MEDICAL HISTORY:  1.  Unstable angina.  2.  Coronary artery disease.  3.  Large abdominal aortic aneurysm.  4.  Hypertension.  5.  Hyperlipidemia.  6.  Questionable abnormal liver.  7.  History of colonic polyps.  8.  Obstructive sleep apnea.   HOME MEDICATIONS:  1.  Plavix.  2.  Crestor.  3.  Aspirin.  4.  Toprol.   PAST SURGICAL HISTORY:  Cardiac catheterization done August 29, 2003, by Dr.  Yates Decamp.  The final impressions were:  1.  Coronary angioplasty and stenting of the PDA branch with a 2.5 x 12 mm      stent reducing stenosis of 95% to 0%.  2.  Angioplasty and stenting of the mid and proximal right coronary artery      with a 3.5 x 32 mm stent.   FAMILY HISTORY:  Mother died at age 2 with CHF.  Father died at age 36 with  a CVA.   SOCIAL HISTORY:  Positive cigarette history.  The patient stopped  approximately 11 years ago, started cigarettes at the age of 55, less than  one pack per day.  Alcohol:  The patient denies.   REVIEW OF SYSTEMS:  Negative for dysuria,  negative change in urine odor,  positive constipation, no chest pain, no current shortness of breath.  All  other systems as per HPI, otherwise are negative.   PHYSICAL EXAMINATION:  GENERAL:  The patient is cooperative.  VITAL SIGNS:  Temperature is 98.1, heart rate 107, respirations 18, blood  pressure 114/73 on admission to the ER.  HEENT:  The right eye does not respond to light secondary to an old injury.  The left pupil constricts to light.  Extraocular movement is intact.  There  is icterus bilaterally.  NECK:  Supple.  No lymphadenopathy.  Thyroid is not palpable.  No oral  thrush.  CARDIAC:  S1, S2 is present, regular rate and rhythm.  No S3, no S4.  PMI  not palpable.  No parasternal heave.  RESPIRATORY:  Lungs are clear bilaterally.  No crackles, no wheezes.  ABDOMEN:  Soft, nontender, nondistended, positive bowel sounds.  No  hepatosplenomegaly.  EXTREMITIES:  No leg edema.  NEUROLOGIC:  The patient is alert and oriented x 3.  MUSCULOSKELETAL:  5/5 upper and lower extremity strength.   LABORATORIES:  White blood cell count is 12.3, hemoglobin 16.4.  BUN 23,  creatinine 1.4.  UA:  Bilirubin large, ketones 15, nitrites positive,  leukocytes small, WBCs 3-6, squamous cells rare.   ASSESSMENT AND PLAN:  1.  Fever, shortness of breath.  Chest x-ray suggests a combination of early      pneumonia accompanied by atelectasis bilaterally.  Will continue empiric      IV antibiotics with ceftriaxone and azithromycin.  Will provide an      incentive spirometer.  Will also provide nebulized breathing treatments.      Will consider obtaining a CAT scan with contrast to rule out pulmonary      embolism.  2.  Urinary tract infection.  UA suggests urinary tract infection.  Will      again cover with ceftriaxone for now pending sensitivities.  Will also      repeat UA in the a.m. 3.  History of liver disease.  Will check liver enzymes, will also order      ultrasound of the liver.  4.   History of abdominal aortic  aneurysm.  Will order CT scan with contrast      to verify status of aneurysm.  5.  Hypertension.  Blood pressure is currently stable.  The patient does not      recall the dose of his home medications.  Will monitor blood pressure      today and confirm patient's home medications in the a.m.  6.  Hyperlipidemia.  Will check a.m. fasting lipid profile.  Will hold      Crestor for now pending results of patient's liver function tests which      I will order in the a.m.  The patient currently has some bilateral      icterus.  7.  Coronary artery disease history.  Currently, the patient is chest pain-      free.  Will continue aspirin for now.  8.  Constipation.  Will provide Lactulose for now.  9.  Gastrointestinal prophylaxis.  Will provide Protonix 40 mg p.o. daily.  10. Deep venous thrombosis prophylaxis.  Will provide Lovenox 30 mg subcu      daily.      Orla   OR/MEDQ  D:  03/05/2004  T:  03/05/2004  Job:  [id number]   cc:   [name], M.D.  [address]  [place]  [place] [address]  Fax: [phone]

## 2010-10-25 ENCOUNTER — Encounter: Payer: Self-pay | Admitting: Cardiology

## 2010-10-25 DIAGNOSIS — F329 Major depressive disorder, single episode, unspecified: Secondary | ICD-10-CM | POA: Insufficient documentation

## 2010-10-25 DIAGNOSIS — I10 Essential (primary) hypertension: Secondary | ICD-10-CM | POA: Insufficient documentation

## 2010-10-25 DIAGNOSIS — I2510 Atherosclerotic heart disease of native coronary artery without angina pectoris: Secondary | ICD-10-CM | POA: Insufficient documentation

## 2010-10-25 DIAGNOSIS — I714 Abdominal aortic aneurysm, without rupture: Secondary | ICD-10-CM | POA: Insufficient documentation

## 2010-10-25 DIAGNOSIS — E785 Hyperlipidemia, unspecified: Secondary | ICD-10-CM | POA: Insufficient documentation

## 2010-10-28 ENCOUNTER — Encounter: Payer: Self-pay | Admitting: *Deleted

## 2010-10-29 ENCOUNTER — Encounter: Payer: Self-pay | Admitting: Cardiology

## 2010-10-29 ENCOUNTER — Ambulatory Visit (INDEPENDENT_AMBULATORY_CARE_PROVIDER_SITE_OTHER): Payer: Medicare Other | Admitting: Cardiology

## 2010-10-29 ENCOUNTER — Encounter: Payer: Self-pay | Admitting: *Deleted

## 2010-10-29 DIAGNOSIS — I2510 Atherosclerotic heart disease of native coronary artery without angina pectoris: Secondary | ICD-10-CM

## 2010-10-29 DIAGNOSIS — I714 Abdominal aortic aneurysm, without rupture, unspecified: Secondary | ICD-10-CM

## 2010-10-29 DIAGNOSIS — I10 Essential (primary) hypertension: Secondary | ICD-10-CM

## 2010-10-29 DIAGNOSIS — E785 Hyperlipidemia, unspecified: Secondary | ICD-10-CM

## 2010-10-29 MED ORDER — METOPROLOL SUCCINATE ER 25 MG PO TB24: ORAL_TABLET | ORAL | Status: DC

## 2010-10-29 MED ORDER — AMLODIPINE BESYLATE 10 MG PO TABS: 10.0000 mg | ORAL_TABLET | Freq: Every day | ORAL | Status: DC

## 2010-10-29 NOTE — Assessment & Plan Note (Signed)
His lipids are being treated. 

## 2010-10-29 NOTE — Assessment & Plan Note (Signed)
Coronary disease is stable.  I feel we do not need to proceed with any testing at this time.  He has infrequent in significant symptoms.

## 2010-10-29 NOTE — Progress Notes (Signed)
HPI The patient is seen today to establish with me as his cardiologist.  He was followed by [name] before he retired.  I have reviewed the old records and I have updated the electronic medical record.  The patient has known coronary disease.  He has poor tandem stents in his right coronary artery and one stent in his LAD.  In January, 2008, catheterization revealed no major obstructive narrowing.  He has rare chest discomfort that is not related to exertion.  Is not having any shortness of breath.  There's been no syncope or presyncope.  He has difficulty sleeping.  His wife died in the past year he also lost his son.  He is alone and continuing to deal with this. No Known Allergies  Current Outpatient Prescriptions  Medication Sig Dispense Refill  . amLODipine (NORVASC) 5 MG tablet Take 5 mg by mouth daily.        Marland Kitchen aspirin 81 MG tablet Take 81 mg by mouth daily.        . clopidogrel (PLAVIX) 75 MG tablet Take 75 mg by mouth daily.        . metoprolol succinate (TOPROL-XL) 25 MG 24 hr tablet Take 12.5 mg by mouth daily.        . nitroGLYCERIN (NITROSTAT) 0.4 MG SL tablet Place 0.4 mg under the tongue every 5 (five) minutes as needed.        . rosuvastatin (CRESTOR) 20 MG tablet Take 10 mg by mouth daily.        Marland Kitchen zolpidem (AMBIEN) 10 MG tablet Take 10 mg by mouth at bedtime as needed.        Marland Kitchen DISCONTD: ALPRAZolam (XANAX) 0.25 MG tablet Take 0.25 mg by mouth at bedtime as needed.        Marland Kitchen DISCONTD: simvastatin (ZOCOR) 80 MG tablet Take 40 mg by mouth at bedtime.          History   Social History  . Marital Status: Widowed    Spouse Name: N/A    Number of Children: N/A  . Years of Education: N/A   Occupational History  . Not on file.   Social History Main Topics  . Smoking status: Former Smoker -- 0.8 packs/day for 10 years    Types: Cigarettes    Quit date: 05/26/1989  . Smokeless tobacco: Never Used  . Alcohol Use: No  . Drug Use: No  . Sexually Active: Not on file    Other Topics Concern  . Not on file   Social History Narrative   Retired Printmaker    Family History  Problem Relation Age of Onset  . Heart failure Mother   . Stroke Father     Past Medical History  Diagnosis Date  . CAD (coronary artery disease)     DES x4 RCA, DES LAD,  /    catheterization January, 2008, nonobstructive disease  . Cough     December, 2011  . Hypertension   . Dyslipidemia   . Carotid bruit     Doppler, no significant stenosis  . AAA (abdominal aortic aneurysm)     Stent graft  . Colon polyps   . Depression     Situational depression with loss of family members    Past Surgical History  Procedure Date  . Argioplasty     ROS  Patient denies fever, chills, headache, sweats, rash, change in vision, change in hearing, cough, nausea vomiting, urinary symptoms.  All other systems are reviewed and  are negative  PHYSICAL EXAM He is stable today.  Head is atraumatic.  He has mild variation with some slight drooping of the right eyelid.  There is no jugulovenous distention.  Lungs are clear progressive care for his not labored.  Cardiac exam reveals S1-S2.  There are no clicks or significant murmurs.  The abdomen is soft there is no peripheral edema.  There are no musculoskeletal deformities.  There are no skin rashes. Filed Vitals:   10/29/10 0949  BP: 139/91  Pulse: 71  Height: 6\' 1"  (1.854 m)  Weight: 216 lb (97.977 kg)    EKG Is not done today.  ASSESSMENT & PLAN

## 2010-10-29 NOTE — Assessment & Plan Note (Signed)
He had a successful stent graft for his abdominal aortic aneurysm in the past.  He does not any further studies at this time.

## 2010-10-29 NOTE — Patient Instructions (Signed)
Follow up as scheduled. Increase Amlodipine to 10 mg daily. You may take 2 of your 5 mg tablets until gone and then call the pharmacy to have the 10 mg tablets filled.

## 2010-10-29 NOTE — Assessment & Plan Note (Signed)
His blood pressure has been mildly elevated at home.  Today it is very slightly elevated.  His heart rate does tend to run between 60 and 70.  We will increase his amlodipine to 10 mg daily.  Because we're changing her medicine because he's had some chest discomfort plan to see him back in 3 months for the first followup.

## 2011-01-09 ENCOUNTER — Other Ambulatory Visit: Payer: Self-pay | Admitting: Vascular Surgery

## 2011-01-09 DIAGNOSIS — I714 Abdominal aortic aneurysm, without rupture: Secondary | ICD-10-CM

## 2011-01-29 ENCOUNTER — Encounter: Payer: Self-pay | Admitting: Cardiology

## 2011-01-31 ENCOUNTER — Encounter: Payer: Self-pay | Admitting: Cardiology

## 2011-01-31 ENCOUNTER — Ambulatory Visit (INDEPENDENT_AMBULATORY_CARE_PROVIDER_SITE_OTHER): Payer: Medicare Other | Admitting: Physician Assistant

## 2011-01-31 ENCOUNTER — Telehealth: Payer: Self-pay | Admitting: *Deleted

## 2011-01-31 ENCOUNTER — Encounter: Payer: Self-pay | Admitting: *Deleted

## 2011-01-31 DIAGNOSIS — E785 Hyperlipidemia, unspecified: Secondary | ICD-10-CM

## 2011-01-31 DIAGNOSIS — I10 Essential (primary) hypertension: Secondary | ICD-10-CM

## 2011-01-31 DIAGNOSIS — I2510 Atherosclerotic heart disease of native coronary artery without angina pectoris: Secondary | ICD-10-CM

## 2011-01-31 DIAGNOSIS — Z79899 Other long term (current) drug therapy: Secondary | ICD-10-CM

## 2011-01-31 MED ORDER — NITROGLYCERIN 0.4 MG SL SUBL: 0.4000 mg | SUBLINGUAL_TABLET | SUBLINGUAL | Status: DC | PRN

## 2011-01-31 NOTE — Telephone Encounter (Signed)
exercise stress cardiolite Scheduled for 02-04-2011 @ [facility]

## 2011-01-31 NOTE — Assessment & Plan Note (Signed)
Reassess with an FLP/LFT profile. LDL target 70 or less, if feasible.

## 2011-01-31 NOTE — Assessment & Plan Note (Signed)
Continue current medication regimen

## 2011-01-31 NOTE — Patient Instructions (Addendum)
Follow up as scheduled. Your physician recommends that you continue on your current medications as directed. Please refer to the Current Medication list given to you today. Your physician has requested that you have en exercise stress cardiolite. For further information please visit [email or web address] Please follow instruction sheet, as given.  Your physician recommends that you go to the [facility] for a FASTING lipid profile and liver function labs. Do not eat or drink after midnight.

## 2011-01-31 NOTE — Progress Notes (Signed)
HPI: the patient returns for scheduled followup.  Patient reports interim development of CP, with most intense episode 2-3 weeks ago. This occurred at 10 PM, while sitting watching TV. Initial episode most intense, 8/10, with no radiation, and of brief duration. A second episode followed, less intense, for which he took one NTG tablet, with prompt relief. Has not had a recurrent episode in 1 week. Of note, denies any exertional CP. Also states he has "always had a little bit". He cannot recall his symptoms, prior to undergoing coronary PCI. Last catheterization, 2008, by [name], yielded nonobstructive CAD, with patent stents. He has not had any subsequent ischemic evaluation.  No Known Allergies  Current Outpatient Prescriptions on File Prior to Visit  Medication Sig Dispense Refill  . amLODipine (NORVASC) 10 MG tablet Take 1 tablet (10 mg total) by mouth daily.  30 tablet  6  . aspirin 81 MG tablet Take 81 mg by mouth daily.        . clopidogrel (PLAVIX) 75 MG tablet Take 75 mg by mouth daily.        . metoprolol succinate (TOPROL-XL) 25 MG 24 hr tablet Take 1/2 tablet (12.5 mg) by mouth once daily.  15 tablet  6  . nitroGLYCERIN (NITROSTAT) 0.4 MG SL tablet Place 0.4 mg under the tongue every 5 (five) minutes as needed.        . rosuvastatin (CRESTOR) 20 MG tablet Take 10 mg by mouth daily.        Marland Kitchen zolpidem (AMBIEN) 10 MG tablet Take 10 mg by mouth at bedtime as needed.          Past Medical History  Diagnosis Date  . CAD (coronary artery disease)     DES x4 RCA, DES LAD,  /    catheterization January, 2008, nonobstructive disease  . Cough     December, 2011  . Hypertension   . Dyslipidemia   . Carotid bruit     Doppler, no significant stenosis  . AAA (abdominal aortic aneurysm)     Stent graft  . Colon polyps   . Depression     Situational depression with loss of family members    Past Surgical History  Procedure Date  . Argioplasty     History   Social History    . Marital Status: Widowed    Spouse Name: N/A    Number of Children: N/A  . Years of Education: N/A   Occupational History  . Not on file.   Social History Main Topics  . Smoking status: Former Smoker -- 0.8 packs/day for 10 years    Types: Cigarettes    Quit date: 05/26/1989  . Smokeless tobacco: Never Used  . Alcohol Use: No  . Drug Use: No  . Sexually Active: Not on file   Other Topics Concern  . Not on file   Social History Narrative   Retired Printmaker    Family History  Problem Relation Age of Onset  . Heart failure Mother   . Stroke Father     ROS: Negative for exertional chest pain, DOE, orthopnea, PND, lower extremity edema, palpitations, presyncope/syncope, claudication, reflux, hematuria, hematochezia, or melena. Remaining systems reviewed, and are negative.   PHYSICAL EXAM:  BP 135/75  Pulse 59  Ht 6\' 1"  (1.854 m)  Wt 215 lb (97.523 kg)  BMI 28.37 kg/m2  GENERAL: well-nourished, well-developed; NAD HEENT: NCAT, PERRLA, EOMI; sclera clear; no xanthelasma NECK: palpable bilateral carotid pulses, no bruits; no JVD;  no TM LUNGS: CTA bilaterally CARDIAC: RRR (S1, S2); no significant murmurs; no rubs or gallops ABDOMEN: soft, non-tender; intact BS EXTREMETIES: intact distal pulses; no significant peripheral edema SKIN: warm/dry; no obvious rash/lesions MUSCULOSKELETAL: no joint deformity NEURO: no focal deficit; NL affect   EKG: sinus bradycardia 57 bpm; normal axis; early repolarization changes    ASSESSMENT & PLAN:

## 2011-01-31 NOTE — Assessment & Plan Note (Addendum)
We'll order a GXT Cardiolite to rule out ischemia. Continue current medications, with well-controlled BP/HR. Schedule early followup with myself/Dr. Myrtis Ser, in next few weeks.  Patient seen and examined in conjunction with Dr. Myrtis Ser.

## 2011-02-03 NOTE — Telephone Encounter (Signed)
Auth # [id number]  Exp 03/20/11

## 2011-02-04 ENCOUNTER — Encounter: Payer: Self-pay | Admitting: *Deleted

## 2011-02-04 DIAGNOSIS — I2510 Atherosclerotic heart disease of native coronary artery without angina pectoris: Secondary | ICD-10-CM

## 2011-02-07 ENCOUNTER — Telehealth: Payer: Self-pay | Admitting: *Deleted

## 2011-02-07 NOTE — Telephone Encounter (Signed)
Pt notified of results and verbalized understanding  

## 2011-02-07 NOTE — Telephone Encounter (Signed)
Notes Recorded by Gene Serpe, PA on 02/05/2011 at 11:44 AM LDL 61, but elevated LFTs. Please send copy of results to Dr Lysbeth Galas for review.

## 2011-02-07 NOTE — Telephone Encounter (Signed)
Message copied by Arlyss Gandy on Fri Feb 07, 2011  4:43 PM ------      Message from: Rande Brunt      Created: Wed Feb 05, 2011 11:45 AM       Low risk study with mild peri infarct ischemia. Will  Review at OV.

## 2011-02-24 ENCOUNTER — Encounter: Payer: Self-pay | Admitting: Cardiology

## 2011-02-28 ENCOUNTER — Ambulatory Visit (INDEPENDENT_AMBULATORY_CARE_PROVIDER_SITE_OTHER): Payer: Medicare Other | Admitting: Cardiology

## 2011-02-28 ENCOUNTER — Encounter: Payer: Self-pay | Admitting: Cardiology

## 2011-02-28 DIAGNOSIS — R17 Unspecified jaundice: Secondary | ICD-10-CM | POA: Insufficient documentation

## 2011-02-28 DIAGNOSIS — I10 Essential (primary) hypertension: Secondary | ICD-10-CM

## 2011-02-28 DIAGNOSIS — E785 Hyperlipidemia, unspecified: Secondary | ICD-10-CM

## 2011-02-28 DIAGNOSIS — I2510 Atherosclerotic heart disease of native coronary artery without angina pectoris: Secondary | ICD-10-CM

## 2011-02-28 NOTE — Assessment & Plan Note (Signed)
His recent lipid evaluation revealed that his LDL was quite good.  No change in therapy.

## 2011-02-28 NOTE — Assessment & Plan Note (Signed)
Blood pressure is now controlled. No change in therapy. 

## 2011-02-28 NOTE — Assessment & Plan Note (Signed)
Coronary disease is stable.  His nuclear study revealed old mild scar and slight peri-infarct ischemia.  He does not need further workup at this time.

## 2011-02-28 NOTE — Patient Instructions (Signed)
Continue all current medications. Your physician wants you to follow up in: 6 months.  You will receive a reminder letter in the mail one-two months in advance.  If you don't receive a letter, please call our office to schedule the follow up appointment   

## 2011-02-28 NOTE — Assessment & Plan Note (Signed)
The patient's bilirubin is elevated.  This appears to be unchanged since 2010.  The other liver functions are normal.  No further workup from the cardiology viewpoint.  We'll see him back in 6 months.

## 2011-02-28 NOTE — Progress Notes (Signed)
HPI Patient is seen today for followup coronary artery disease.  I had seen him last June, 2012.  He then had some mild chest discomfort and was evaluated nicely in the office by Mr. Serpe.  Nuclear stress study was ordered.  This study revealed an ejection fraction of 55% and no significant ischemia.  There was mild old scar.  There may have been slight peri-infarct ischemia.  In addition his lipids were rechecked along with his liver function.  It was noted that his bilirubin was elevated at 2.9. I have reviewed old records.  It appears that his bilirubin was 2.4 in 2010.  His other LFTs are normal.  This will need to be followed up by his primary physician.  Today the patient returns and is not having any significant chest pain.  I have reviewed all the results with him.   No Known Allergies  Current Outpatient Prescriptions  Medication Sig Dispense Refill  . amLODipine (NORVASC) 10 MG tablet Take 1 tablet (10 mg total) by mouth daily.  30 tablet  6  . aspirin 81 MG tablet Take 81 mg by mouth daily.        . clopidogrel (PLAVIX) 75 MG tablet Take 75 mg by mouth daily.        . metoprolol succinate (TOPROL-XL) 25 MG 24 hr tablet Take 1/2 tablet (12.5 mg) by mouth once daily.  15 tablet  6  . nitroGLYCERIN (NITROSTAT) 0.4 MG SL tablet Place 1 tablet (0.4 mg total) under the tongue every 5 (five) minutes as needed.  25 tablet  3  . rosuvastatin (CRESTOR) 20 MG tablet Take 10 mg by mouth daily.        Marland Kitchen zolpidem (AMBIEN) 10 MG tablet Take 10 mg by mouth at bedtime as needed.          History   Social History  . Marital Status: Widowed    Spouse Name: N/A    Number of Children: N/A  . Years of Education: N/A   Occupational History  . Not on file.   Social History Main Topics  . Smoking status: Former Smoker -- 0.8 packs/day for 10 years    Types: Cigarettes    Quit date: 05/26/1989  . Smokeless tobacco: Never Used  . Alcohol Use: No  . Drug Use: No  . Sexually Active: Not on file    Other Topics Concern  . Not on file   Social History Narrative   Retired Printmaker    Family History  Problem Relation Age of Onset  . Heart failure Mother   . Stroke Father     Past Medical History  Diagnosis Date  . CAD (coronary artery disease)     DES x4 RCA, DES LAD,  /    catheterization January, 2008, nonobstructive disease  . Cough     December, 2011  . Hypertension   . Dyslipidemia   . Carotid bruit     Doppler, no significant stenosis  . AAA (abdominal aortic aneurysm)     Stent graft  . Colon polyps   . Depression     Situational depression with loss of family members  . 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 Argioplasty     ROS  Patient denies fever, chills, headache, sweats, rash, change in vision, change in hearing, chest pain, cough, nausea vomiting, urinary symptoms.  All other systems  are reviewed and are negative. PHYSICAL EXAM Patient is stable today.  Head is atraumatic.  There is no jugular venous distention.  Lungs are clear.  Respiratory effort is not labored.  Cardiac exam Reveals an S1-S2.  No clicks or significant murmurs.  Abdomen is soft there is no peripheral edema. Filed Vitals:   02/28/11 1250  BP: 137/73  Pulse: 57  Resp: 16  Height: 6\' 1"  (1.854 m)  Weight: 218 lb (98.884 kg)    EKG is not done today. ASSESSMENT & PLAN

## 2011-03-05 LAB — BLOOD GAS, ARTERIAL
Bicarbonate: 21.6
Drawn by: 181601
FIO2: 0.21
O2 Saturation: 97
Patient temperature: 98.6
pH, Arterial: 7.434

## 2011-03-05 LAB — URINALYSIS, ROUTINE W REFLEX MICROSCOPIC
Glucose, UA: NEGATIVE
Hgb urine dipstick: NEGATIVE
Protein, ur: NEGATIVE
Specific Gravity, Urine: 1.013
Urobilinogen, UA: 1

## 2011-03-05 LAB — COMPREHENSIVE METABOLIC PANEL
ALT: 20
Albumin: 3.9
Alkaline Phosphatase: 92
Calcium: 9.3
Glucose, Bld: 115 — ABNORMAL HIGH
Potassium: 3.8
Sodium: 133 — ABNORMAL LOW
Total Protein: 6.9

## 2011-03-05 LAB — BASIC METABOLIC PANEL
BUN: 10
CO2: 24
Calcium: 8.3 — ABNORMAL LOW
Chloride: 109
Creatinine, Ser: 0.99

## 2011-03-05 LAB — TYPE AND SCREEN
ABO/RH(D): O POS
Antibody Screen: NEGATIVE

## 2011-03-05 LAB — CBC
HCT: 36.7 — ABNORMAL LOW
Hemoglobin: 12.8 — ABNORMAL LOW
Hemoglobin: 15.8
MCHC: 33.8
Platelets: 125 — ABNORMAL LOW
Platelets: 158
RDW: 15.6 — ABNORMAL HIGH
WBC: 7.9

## 2011-03-05 LAB — PROTIME-INR
INR: 1
Prothrombin Time: 13.1

## 2011-03-12 LAB — DIFFERENTIAL
Basophils Absolute: 0
Basophils Relative: 1
Monocytes Relative: 7
Neutro Abs: 4.8
Neutrophils Relative %: 69

## 2011-03-12 LAB — COMPREHENSIVE METABOLIC PANEL
Alkaline Phosphatase: 93
BUN: 16
CO2: 25
GFR calc non Af Amer: >60
Glucose, Bld: 137 — ABNORMAL HIGH
Potassium: 3.7
Total Bilirubin: 1.8 — ABNORMAL HIGH
Total Protein: 6.3

## 2011-03-12 LAB — CBC
HCT: 39.7
HCT: 40.3
Hemoglobin: 13.4
Hemoglobin: 13.9
MCV: 85.9
Platelets: 162
RDW: 15.4 — ABNORMAL HIGH
RDW: 15.5 — ABNORMAL HIGH

## 2011-03-12 LAB — CK TOTAL AND CKMB (NOT AT ARMC)
CK, MB: 1.7
Relative Index: INVALID
Total CK: 91

## 2011-03-12 LAB — PROTIME-INR
INR: 1
Prothrombin Time: 13.1

## 2011-03-12 LAB — POCT CARDIAC MARKERS
CKMB, poc: <1 — ABNORMAL LOW
Myoglobin, poc: 86.1
Operator id: 270651

## 2011-03-12 LAB — MAGNESIUM: Magnesium: 2.1

## 2011-04-29 ENCOUNTER — Ambulatory Visit: Payer: Medicare Other | Admitting: Vascular Surgery

## 2011-04-29 ENCOUNTER — Encounter: Payer: Self-pay | Admitting: Vascular Surgery

## 2011-04-29 ENCOUNTER — Other Ambulatory Visit: Payer: Medicare Other

## 2011-05-06 ENCOUNTER — Ambulatory Visit: Payer: Medicare Other | Admitting: Vascular Surgery

## 2011-05-06 ENCOUNTER — Other Ambulatory Visit: Payer: Medicare Other

## 2011-05-10 ENCOUNTER — Other Ambulatory Visit: Payer: Self-pay | Admitting: Cardiology

## 2011-05-30 ENCOUNTER — Other Ambulatory Visit: Payer: Self-pay | Admitting: Vascular Surgery

## 2011-05-30 LAB — CREATININE, SERUM: Creat: 1.15 mg/dL (ref 0.50–1.35)

## 2011-06-02 ENCOUNTER — Encounter: Payer: Self-pay | Admitting: Vascular Surgery

## 2011-06-03 ENCOUNTER — Ambulatory Visit (INDEPENDENT_AMBULATORY_CARE_PROVIDER_SITE_OTHER): Payer: Medicare Other | Admitting: Vascular Surgery

## 2011-06-03 ENCOUNTER — Encounter: Payer: Self-pay | Admitting: Vascular Surgery

## 2011-06-03 ENCOUNTER — Ambulatory Visit
Admission: RE | Admit: 2011-06-03 | Discharge: 2011-06-03 | Disposition: A | Discharge status: Home / Self Care | Payer: Medicare Other | Source: Ambulatory Visit | Attending: Vascular Surgery | Admitting: Vascular Surgery

## 2011-06-03 VITALS — BP 157/81 | HR 55 | Resp 16 | Ht 73.0 in | Wt 214.0 lb

## 2011-06-03 DIAGNOSIS — I714 Abdominal aortic aneurysm, without rupture: Secondary | ICD-10-CM

## 2011-06-03 MED ORDER — IOHEXOL 350 MG/ML SOLN
100.0000 mL | Freq: Once | INTRAVENOUS | Status: AC | PRN
  Administered 2011-06-03: 100 mL via INTRAVENOUS

## 2011-06-03 NOTE — Progress Notes (Deleted)
Subjective:     Patient ID: [name], male   DOB: 07-18-1927, 78 y.o.   MRN: [id number]  HPI this 78 year old male returns for annual followup regarding his aortic stent graft repair for an abdominal aortic aneurysm performed by Dr. [name] in October 2008. He denies any abdominal or back pain. It is not ambulate much but denies claudication symptoms. He has no new medical problems at this point.  Physician Discharge Summary  Patient ID: [name] MRN: [id number] DOB/AGE: 20-Feb-1928 78 y.o.  Admit date: (Not on file) Discharge date: 06/03/2011  Admission Diagnosis: @ADMDX @  Discharge Diagnoses:  @ADMDX @  Secondary Diagnoses: Active Problems:  * No active hospital problems. *    Procedures: *** HERNIA:  [name] is an 78 y.o. male who was referred for evaluation of a  {Symptoms; hernia:13574}. Symptoms were first noted {numbers; 0-10:33138} {Time; days-years:10146} ago. Symptoms or injury {did/did not:14019} occur at work. Pain is {hernia sx:13577} and {is/is not:9024} reducible. The patient has no symptoms of  {hernia etiology:13578::"chronic constipation","chronic cough","difficulty urinating"}. There {hpi assoc has/has/not:15037} previous hx of groin surgery.      Discharged Condition: {condition:18240}  Hospital Course: ***  Consults:  @CONSULT @  Significant Diagnostic Studies: CBC    Component Value Date/Time   WBC 7.9 03/23/2007 0320   RBC 4.31 03/23/2007 0320   HGB 12.8 DELTA CHECK NOTED* 03/23/2007 0320   HCT 36.7* 03/23/2007 0320   PLT 125* 03/23/2007 0320   MCV 85.1 03/23/2007 0320   MCHC 35.0 03/23/2007 0320   RDW 15.5* 03/23/2007 0320   LYMPHSABS 1.3 11/12/2006 2200   MONOABS 0.5 11/12/2006 2200   EOSABS 0.3 11/12/2006 2200   BASOSABS 0.0 11/12/2006 2200    BMET    Component Value Date/Time   NA 136 03/23/2007 0320   K 3.9 03/23/2007 0320   CL 109 03/23/2007 0320   CO2 24 03/23/2007 0320   GLUCOSE 95 03/23/2007 0320   BUN 16 05/30/2011  1110   CREATININE 1.15 05/30/2011 1110   CREATININE 0.99 03/23/2007 0320   CALCIUM 8.3* 03/23/2007 0320   GFRNONAA >60 03/23/2007 0320   GFRAA  Value: >60        The eGFR has been calculated using the MDRD equation. This calculation has not been validated in all clinical 03/23/2007 0320    COAG Lab Results  Component Value Date   INR 1.0 03/18/2007   INR 1.0 11/13/2006   No results found for this basename: PTT    Disposition:   Discharge Orders    Future Appointments: Provider: Department: Dept Phone: Center:   06/03/2011 10:45 AM Alex Ochoa, MD [facility] [phone] [facility]     Cannot display discharge medications since this is not an admission.    SignedJosephina Escobar D 06/03/2011, 10:35 AM        Review of Systems     Objective:   Physical Exam     Assessment:     ***    Plan:     ***

## 2011-06-03 NOTE — Progress Notes (Signed)
Addended by: Adria Dill L on: 06/03/2011 11:03 AM   Modules accepted: Orders

## 2011-06-03 NOTE — Progress Notes (Signed)
Subjective:     Patient ID: [name], male   DOB: [birth year], 78 y.o.   MRN: [id number]  HPI this 78 year old male returns 1 year following his last appointment for followup of his stent graft repair of abdominal aortic aneurysm performed by Dr. [name] in October 2008. Patient has been doing well with no abdominal or back pain. He does not ambulate long distances but denies claudication. His bowels have been regular and he is maintaining his normal activity level but no specific complaints.  Past Medical History  Diagnosis Date  . CAD (coronary artery disease)     DES x4 RCA, DES LAD,  /    catheterization January, 2008, nonobstructive disease  . Cough     December, 2011  . Hypertension   . Dyslipidemia   . Carotid bruit     Doppler, no significant stenosis  . AAA (abdominal aortic aneurysm)     Stent graft  . Colon polyps   . Depression     Situational depression with loss of family members  . 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 Hyperlipidemia   . Myocardial infarction 2009  . Leg pain     History  Substance Use Topics  . Smoking status: Former Smoker -- 0.8 packs/day for 10 years    Types: Cigarettes    Quit date: 05/26/1989  . Smokeless tobacco: Never Used  . Alcohol Use: No    Family History  Problem Relation Age of Onset  . Heart failure Mother   . Coronary artery disease Mother   . Stroke Father   . Coronary artery disease Sister     No Known Allergies  Current outpatient prescriptions:amLODipine (NORVASC) 10 MG tablet, Take 1 tablet (10 mg total) by mouth daily., Disp: 30 tablet, Rfl: 6;  aspirin 81 MG tablet, Take 81 mg by mouth daily.  , Disp: , Rfl: ;  clopidogrel (PLAVIX) 75 MG tablet, Take 75 mg by mouth daily.  , Disp: , Rfl: ;  nitroGLYCERIN (NITROSTAT) 0.4 MG SL tablet, Place 1 tablet (0.4 mg total) under the tongue every 5 (five) minutes as needed., Disp: 25 tablet, Rfl: 3 rosuvastatin (CRESTOR) 20 MG tablet, Take 10 mg by  mouth daily.  , Disp: , Rfl: ;  TOPROL XL 25 MG 24 hr tablet, TAKE (1/2) TABLET DAILY., Disp: 15 each, Rfl: 6;  zolpidem (AMBIEN) 10 MG tablet, Take 10 mg by mouth at bedtime as needed.  , Disp: , Rfl: ;  ALPRAZolam (XANAX) 0.25 MG tablet, Take 0.25 mg by mouth at bedtime as needed.  , Disp: , Rfl: ;  simvastatin (ZOCOR) 80 MG tablet, Take 40 mg by mouth at bedtime. , Disp: , Rfl:  No current facility-administered medications for this visit. Facility-Administered Medications Ordered in Other Visits: iohexol (OMNIPAQUE) 350 MG/ML injection 100 mL, 100 mL, Intravenous, Once PRN, Medication Radiologist, 100 mL at 06/03/11 0935  BP 157/81  Pulse 55  Resp 16  Ht 6\' 1"  (1.854 m)  Wt 214 lb (97.07 kg)  BMI 28.23 kg/m2  SpO2 98%  Body mass index is 28.23 kg/(m^2).         Review of Systems he denies chest pain, dyspnea on exertion, PND, orthopnea, hemoptysis, lateralizing weakness, syncope, amaurosis fugax, diplopia, blurred vision, syncope, and all others symptoms completely negative review of systems     Objective:   Physical Exam blood pressure 170/81 heart rate 85 respirations 16  General well-developed well-nourished male no apparent stress alert and oriented x3 HEENT normal for age Lungs no rhonchi or wheezing Cardiovascular regular rhythm no murmurs carotid pulses 3+ no audible bruits Abdomen soft nontender no pulsatile mass noted Extremities 3+ femoral popliteal posterior tibial and dorsalis pedis pulse palpable bilaterally Neurologic normal Musculoskeletal 3 major deformities Skin free of rash  Today I ordered a CT angiogram which are reviewed by computer. There is no evidence of endoleak and the aneurysm sac. The graft has not migrated. The sac itself has decreased since diameter from 52-49 mm.     Assessment:     Doing well 4 years and 4 months post aortic stent graft repair for abdominal aortic aneurysm    Plan:     Return in one year with duplex scan in our office to  continue to follow endograft of aortic aneurysm repair

## 2011-06-03 NOTE — Progress Notes (Signed)
Addended by: Adria Dill L on: 06/03/2011 11:23 AM   Modules accepted: Orders

## 2011-07-29 ENCOUNTER — Ambulatory Visit (INDEPENDENT_AMBULATORY_CARE_PROVIDER_SITE_OTHER): Payer: Medicare Other | Admitting: Physician Assistant

## 2011-07-29 ENCOUNTER — Encounter: Payer: Self-pay | Admitting: Physician Assistant

## 2011-07-29 VITALS — BP 122/84 | HR 60 | Ht 73.0 in | Wt 215.0 lb

## 2011-07-29 DIAGNOSIS — I10 Essential (primary) hypertension: Secondary | ICD-10-CM

## 2011-07-29 DIAGNOSIS — E785 Hyperlipidemia, unspecified: Secondary | ICD-10-CM

## 2011-07-29 DIAGNOSIS — I2510 Atherosclerotic heart disease of native coronary artery without angina pectoris: Secondary | ICD-10-CM

## 2011-07-29 DIAGNOSIS — R079 Chest pain, unspecified: Secondary | ICD-10-CM

## 2011-07-29 MED ORDER — FAMOTIDINE 20 MG PO TABS: 20.0000 mg | ORAL_TABLET | Freq: Two times a day (BID) | ORAL | Status: DC

## 2011-07-29 NOTE — Progress Notes (Signed)
[address] [place], [place]  [address] Phone: [phone] Fax:  [phone]  Date:  07/29/2011   Name:  [name]       DOB:  10-22-1927 MRN:  [id number]  PCP:  Sheron Nightingale, FNP at [facility]  Primary Cardiologist:  Dr. Zackery Barefoot in [place]  Primary Electrophysiologist:  None    History of Present Illness: [name] is a 78 y.o. male who presents for evaluation of chest pain.   He has a history of CAD, status post prior stenting to the RCA x4 and Cypher drug-eluting stent to the LAD, hypertension, hyperlipidemia, abdominal aortic aneurysm, status post stent graft repair 10/08, depression.  Last LHC 6/08: LAD stent okay, proximal RCA stents okay, mid to distal RCA stents okay, proximal RCA 30%.  He was evaluated in [place] in 9/12 for chest pain.  Myoview done at that time demonstrated an EF of 59%, inferolateral scar, lateral scar with mild peri-infarct ischemia.  This was reviewed by Dr. Myrtis Ser.  No significant ischemia was noted and continued medical therapy was recommended.  He was seen in follow up at his PCP's office recently.  He thought that leg pain he is having may be from his statin therapy.  He noted some chest pain to his PCP.  An ECG was done and felt to be different.  Dr. Zackery Barefoot is in [place] today but the patient was sent to the [facility] office for evaluation.  He notes left-sided chest discomfort from time to time.  He says that he has been having this for years.  It typically occurs at rest.  It is a sharp pain followed by heavy pain.  He denies radiating symptoms.  He denies associated shortness of breath.  He denies syncope or near-syncope.  He denies associated nausea or diaphoresis.  He denies any symptoms reminiscent of his previous angina.  He denies orthopnea or PND.  He has occasional pedal edema.  He exercises several times a week.  He denies exertional chest pain or exertional shortness of breath.  He describes class II symptoms.  Past  Medical History  Diagnosis Date  . CAD (coronary artery disease)     DES x4 RCA, DES LAD,  /    catheterization January, 2008, nonobstructive disease  . Cough     December, 2011  . Hypertension   . Dyslipidemia   . Carotid bruit     Doppler, no significant stenosis  . AAA (abdominal aortic aneurysm)     Stent graft  . Colon polyps   . Depression     Situational depression with loss of family members  . 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 Hyperlipidemia   . Myocardial infarction 2009  . Leg pain     Current Outpatient Prescriptions  Medication Sig Dispense Refill  . ALPRAZolam (XANAX) 0.25 MG tablet Take 0.25 mg by mouth at bedtime as needed.        Marland Kitchen amLODipine (NORVASC) 10 MG tablet Take 1 tablet (10 mg total) by mouth daily.  30 tablet  6  . aspirin 81 MG tablet Take 81 mg by mouth daily.        . clopidogrel (PLAVIX) 75 MG tablet Take 75 mg by mouth daily.        . nitroGLYCERIN (NITROSTAT) 0.4 MG SL tablet Place 1 tablet (0.4 mg total) under the tongue every 5 (five) minutes as  needed.  25 tablet  3  . rosuvastatin (CRESTOR) 20 MG tablet Take 10 mg by mouth daily.        . simvastatin (ZOCOR) 80 MG tablet Take 40 mg by mouth at bedtime.       . TOPROL XL 25 MG 24 hr tablet TAKE (1/2) TABLET DAILY.  15 each  6  . zolpidem (AMBIEN) 10 MG tablet Take 10 mg by mouth at bedtime as needed.          Allergies: No Known Allergies  History  Substance Use Topics  . Smoking status: Former Smoker -- 0.8 packs/day for 10 years    Types: Cigarettes    Quit date: 05/26/1989  . Smokeless tobacco: Never Used  . Alcohol Use: No     ROS:  Please see the history of present illness.   He denies dysphagia, odynophagia, melena, hematochezia.  He does have a chronic cough.  All other systems reviewed and negative.   PHYSICAL EXAM: VS:  BP 122/84  Pulse 60  Ht 6\' 1"  (1.854 m)  Wt 215 lb (97.523 kg)  BMI 28.37 kg/m2 Well nourished, well developed, in no  acute distress HEENT: normal Neck: no JVD Vascular: No carotid bruits Cardiac:  normal S1, S2; RRR; no murmur Lungs:  clear to auscultation bilaterally, no wheezing, rhonchi or rales Abd: soft, nontender, no hepatomegaly Ext: no edema Skin: warm and dry Neuro:  CNs 2-12 intact, no focal abnormalities noted Psych: Normal affect  EKG:  ECG from PCPs office yesterday reviewed and demonstrates sinus rhythm, heart rate 60, normal axis, no acute changes.  I reviewed his old EKGs in our system and there are no apparent changes from prior tracings.  ASSESSMENT AND PLAN:  1. Chest pain  Atypical.  He had a low risk stress test less than 6 months ago.  His symptoms are unchanged.  They are fairly chronic.  His ECG is stable without change.  He has a followup visit with Dr. Myrtis Ser in [place] in April.  He will keep that appointment.  No further cardiovascular testing is warranted at this time.  Question if his symptoms are gastrointestinal.  I will place him on a trial of Pepcid 20 mg twice a day to see if this helps.  If his chest symptoms increase in frequency or should change, he can be seen back in earlier followup.  Otherwise he can followup with Dr. Myrtis Ser for further recommendations if any.   2. CAD (coronary artery disease) Continue aspirin and Plavix.  Followup with Dr. Myrtis Ser as noted.    3. Hypertension  Controlled.  Continue current therapy.    4. Dyslipidemia  Managed by PCP.      [name], PA-C  3:49 PM 07/29/2011

## 2011-07-29 NOTE — Patient Instructions (Signed)
Your physician recommends that you schedule a follow-up appointment as scheduled with  Dr Myrtis Ser Your physician has recommended you make the following change in your medication: START Pepcid 20 mg twice daily

## 2011-09-04 ENCOUNTER — Ambulatory Visit (INDEPENDENT_AMBULATORY_CARE_PROVIDER_SITE_OTHER): Payer: Medicare Other | Admitting: Cardiology

## 2011-09-04 ENCOUNTER — Encounter: Payer: Self-pay | Admitting: Cardiology

## 2011-09-04 VITALS — BP 130/84 | HR 73 | Ht 73.0 in | Wt 207.0 lb

## 2011-09-04 DIAGNOSIS — R0989 Other specified symptoms and signs involving the circulatory and respiratory systems: Secondary | ICD-10-CM | POA: Insufficient documentation

## 2011-09-04 DIAGNOSIS — I2510 Atherosclerotic heart disease of native coronary artery without angina pectoris: Secondary | ICD-10-CM

## 2011-09-04 DIAGNOSIS — I714 Abdominal aortic aneurysm, without rupture: Secondary | ICD-10-CM

## 2011-09-04 NOTE — Assessment & Plan Note (Signed)
The patient is doing well. He has not had any recurring symptoms. We know that his nuclear scan showed no marked ischemia in 2012. He is stable at this point. No further workup.

## 2011-09-04 NOTE — Progress Notes (Signed)
HPI Patient is seen today to followup coronary disease. I had seen him last in October, 2012. On July 29, 2011 there was question of an EKG change at his primary care office. He was then actually seen by Tereso Newcomer in our [place] office. He was felt to be stable. No further studies were needed. He had a nuclear scan in June, 2012. That study and showed old mild scar with no ischemia. He is doing well today and not having any recurring problems.  No Known Allergies  Current Outpatient Prescriptions  Medication Sig Dispense Refill  . ALPRAZolam (XANAX) 0.25 MG tablet Take 0.25 mg by mouth at bedtime as needed.        Marland Kitchen amLODipine (NORVASC) 10 MG tablet Take 1 tablet (10 mg total) by mouth daily.  30 tablet  6  . aspirin 81 MG tablet Take 81 mg by mouth daily.        Marland Kitchen azithromycin (ZITHROMAX) 250 MG tablet Take 250 mg by mouth daily.      . clopidogrel (PLAVIX) 75 MG tablet Take 75 mg by mouth daily.        Marland Kitchen dextromethorphan-guaiFENesin (MUCINEX DM) 30-600 MG per 12 hr tablet Take 1 tablet by mouth every 12 (twelve) hours.      . famotidine (PEPCID) 20 MG tablet Take 1 tablet (20 mg total) by mouth 2 (two) times daily.  60 tablet  6  . nitroGLYCERIN (NITROSTAT) 0.4 MG SL tablet Place 1 tablet (0.4 mg total) under the tongue every 5 (five) minutes as needed.  25 tablet  3  . rosuvastatin (CRESTOR) 20 MG tablet Take 10 mg by mouth daily.        . simvastatin (ZOCOR) 80 MG tablet Take 40 mg by mouth at bedtime.       . TOPROL XL 25 MG 24 hr tablet TAKE (1/2) TABLET DAILY.  15 each  6  . zolpidem (AMBIEN) 10 MG tablet Take 10 mg by mouth at bedtime as needed.          History   Social History  . Marital Status: Widowed    Spouse Name: N/A    Number of Children: N/A  . Years of Education: N/A   Occupational History  . Not on file.   Social History Main Topics  . Smoking status: Former Smoker -- 0.8 packs/day for 10 years    Types: Cigarettes    Quit date: 05/26/1989  .  Smokeless tobacco: Never Used  . Alcohol Use: No  . Drug Use: No  . Sexually Active: Not on file   Other Topics Concern  . Not on file   Social History Narrative   Retired Printmaker    Family History  Problem Relation Age of Onset  . Heart failure Mother   . Coronary artery disease Mother   . Stroke Father   . Coronary artery disease Sister     Past Medical History  Diagnosis Date  . CAD (coronary artery disease)     DES x4 RCA, DES LAD,  /    catheterization January, 2008, nonobstructive disease  . Cough     December, 2011  . Hypertension   . Dyslipidemia   . Carotid bruit     Doppler, no significant stenosis  . AAA (abdominal aortic aneurysm)     Stent graft  . Colon polyps   . Depression     Situational depression with loss of family members  . Elevated bilirubin  2.4,   2010  /  2.4,  2012,  chronic   . Hyperlipidemia   . Myocardial infarction 2009  . Leg pain     Past Surgical History  Procedure Date  . Argioplasty   . Abdominal aortic aneurysm repair 03/22/2007  . Cholecystectomy 2005    ROS Patient denies fever, chills, headache, sweats, rash, change in vision, change in hearing, chest pain, cough, nausea vomiting, urinary symptoms. All other systems are reviewed and are negative.  PHYSICAL EXAM  Patient is oriented to person time and place. Affect is normal. Head is atraumatic. There is no jugulovenous distention. He has slight drooping of his right eyelid. Lungs are clear. Respiratory effort is nonlabored. Cardiac exam reveals S1 and S2. There no clicks or significant murmurs. The abdomen is soft. There is no peripheral edema.  Filed Vitals:   09/04/11 1347  BP: 130/84  Pulse: 73  Height: 6\' 1"  (1.854 m)  Weight: 207 lb (93.895 kg)     ASSESSMENT & PLAN

## 2011-09-04 NOTE — Patient Instructions (Signed)
Your physician wants you to follow-up in: 6 months. You will receive a reminder letter in the mail one-two months in advance. If you don't receive a letter, please call our office to schedule the follow-up appointment. Your physician recommends that you continue on your current medications as directed. Please refer to the Current Medication list given to you today. 

## 2011-09-04 NOTE — Assessment & Plan Note (Signed)
Patient received a stent graft to his abdominal aortic aneurysm in the past. He's doing very well.

## 2011-10-06 ENCOUNTER — Other Ambulatory Visit: Payer: Self-pay | Admitting: Family Medicine

## 2011-10-06 DIAGNOSIS — R52 Pain, unspecified: Secondary | ICD-10-CM

## 2011-10-09 ENCOUNTER — Ambulatory Visit (HOSPITAL_COMMUNITY)
Admission: RE | Admit: 2011-10-09 | Discharge: 2011-10-09 | Disposition: A | Discharge status: Home / Self Care | Payer: Medicare Other | Source: Ambulatory Visit | Attending: Family Medicine | Admitting: Family Medicine

## 2011-10-09 DIAGNOSIS — R52 Pain, unspecified: Secondary | ICD-10-CM

## 2011-10-09 DIAGNOSIS — M79609 Pain in unspecified limb: Secondary | ICD-10-CM | POA: Insufficient documentation

## 2011-11-18 ENCOUNTER — Other Ambulatory Visit: Payer: Self-pay | Admitting: Family Medicine

## 2011-11-24 ENCOUNTER — Ambulatory Visit
Admission: RE | Admit: 2011-11-24 | Discharge: 2011-11-24 | Disposition: A | Discharge status: Home / Self Care | Payer: Medicare Other | Source: Ambulatory Visit | Attending: Family Medicine | Admitting: Family Medicine

## 2011-11-24 MED ORDER — GADOBENATE DIMEGLUMINE 529 MG/ML IV SOLN
19.0000 mL | Freq: Once | INTRAVENOUS | Status: AC | PRN
  Administered 2011-11-24: 19 mL via INTRAVENOUS

## 2011-12-04 ENCOUNTER — Other Ambulatory Visit: Payer: Self-pay | Admitting: Cardiology

## 2012-04-19 ENCOUNTER — Other Ambulatory Visit: Payer: Self-pay

## 2012-04-19 MED ORDER — AMLODIPINE BESYLATE 10 MG PO TABS: 10.0000 mg | ORAL_TABLET | Freq: Every day | ORAL | Status: DC

## 2012-05-31 ENCOUNTER — Encounter: Payer: Self-pay | Admitting: Vascular Surgery

## 2012-06-01 ENCOUNTER — Ambulatory Visit (INDEPENDENT_AMBULATORY_CARE_PROVIDER_SITE_OTHER): Payer: Medicare Other | Admitting: Neurosurgery

## 2012-06-01 ENCOUNTER — Other Ambulatory Visit (INDEPENDENT_AMBULATORY_CARE_PROVIDER_SITE_OTHER): Payer: Medicare Other | Admitting: *Deleted

## 2012-06-01 ENCOUNTER — Encounter: Payer: Self-pay | Admitting: Neurosurgery

## 2012-06-01 VITALS — BP 139/83 | HR 64 | Resp 16 | Ht 73.0 in | Wt 209.6 lb

## 2012-06-01 DIAGNOSIS — I714 Abdominal aortic aneurysm, without rupture, unspecified: Secondary | ICD-10-CM

## 2012-06-01 DIAGNOSIS — Z48812 Encounter for surgical aftercare following surgery on the circulatory system: Secondary | ICD-10-CM

## 2012-06-01 NOTE — Addendum Note (Signed)
Addended by: Dannielle Karvonen on: 06/01/2012 03:22 PM   Modules accepted: Orders

## 2012-06-01 NOTE — Progress Notes (Signed)
[facility] AAA/Carotid Office Note  CC: AAA endograft repair surveillance Referring Physician: Hart Rochester  History of Present Illness: 79 year old male patient of Dr. Hart Rochester who is status post endograft repair of a AAA by Dr. [name] in 2008. The patient denies any unusual abdominal or back pain. The patient denies any new medical diagnoses or recent surgery.  Past Medical History  Diagnosis Date  . CAD (coronary artery disease)     DES x4 RCA, DES LAD,  /    catheterization January, 2008, nonobstructive disease  . Cough     December, 2011  . Hypertension   . Dyslipidemia   . Carotid bruit     Doppler, no significant stenosis  . AAA (abdominal aortic aneurysm)     Stent graft  . Colon polyps   . Depression     Situational depression with loss of family members  . 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 Hyperlipidemia   . Myocardial infarction 2009  . Leg pain     ROS: [x]  Positive   [ ]  Denies    General: [ ]  Weight loss, [ ]  Fever, [ ]  chills Neurologic: [ ]  Dizziness, [ ]  Blackouts, [ ]  Seizure [ ]  Stroke, [ ]  "Mini stroke", [ ]  Slurred speech, [ ]  Temporary blindness; [ ]  weakness in arms or legs, [ ]  Hoarseness Cardiac: [ ]  Chest pain/pressure, [ ]  Shortness of breath at rest [ ]  Shortness of breath with exertion, [ ]  Atrial fibrillation or irregular heartbeat Vascular: [ ]  Pain in legs with walking, [ ]  Pain in legs at rest, [ ]  Pain in legs at night,  [ ]  Non-healing ulcer, [ ]  Blood clot in vein/DVT,   Pulmonary: [ ]  Home oxygen, [ ]  Productive cough, [ ]  Coughing up blood, [ ]  Asthma,  [ ]  Wheezing Musculoskeletal:  [ ]  Arthritis, [ ]  Low back pain, [ ]  Joint pain Hematologic: [ ]  Easy Bruising, [ ]  Anemia; [ ]  Hepatitis Gastrointestinal: [ ]  Blood in stool, [ ]  Gastroesophageal Reflux/heartburn, [ ]  Trouble swallowing Urinary: [ ]  chronic Kidney disease, [ ]  on HD - [ ]  MWF or [ ]  TTHS, [ ]  Burning with urination, [  ] Difficulty urinating Skin: [ ]  Rashes, [ ]  Wounds Psychological: [ ]  Anxiety, [ ]  Depression   Social History History  Substance Use Topics  . Smoking status: Former Smoker -- 0.8 packs/day for 10 years    Types: Cigarettes    Quit date: 05/26/1989  . Smokeless tobacco: Never Used  . Alcohol Use: No    Family History Family History  Problem Relation Age of Onset  . Heart failure Mother   . Coronary artery disease Mother   . Stroke Father   . Coronary artery disease Sister     No Known Allergies  Current Outpatient Prescriptions  Medication Sig Dispense Refill  . amLODipine (NORVASC) 10 MG tablet Take 1 tablet (10 mg total) by mouth daily.  30 tablet  2  . aspirin 81 MG tablet Take 81 mg by mouth daily.        . clopidogrel (PLAVIX) 75 MG tablet Take 75 mg by mouth daily.        . nitroGLYCERIN (NITROSTAT) 0.4 MG SL tablet Place 1 tablet (0.4 mg total) under the tongue every 5 (five) minutes as needed.  25 tablet  3  . rosuvastatin (CRESTOR) 20 MG  tablet Take 10 mg by mouth daily.        Marland Kitchen zolpidem (AMBIEN) 10 MG tablet Take 10 mg by mouth at bedtime as needed.        . ALPRAZolam (XANAX) 0.25 MG tablet Take 0.25 mg by mouth at bedtime as needed.        Marland Kitchen dextromethorphan-guaiFENesin (MUCINEX DM) 30-600 MG per 12 hr tablet Take 1 tablet by mouth every 12 (twelve) hours.      . famotidine (PEPCID) 20 MG tablet Take 1 tablet (20 mg total) by mouth 2 (two) times daily.  60 tablet  6  . simvastatin (ZOCOR) 80 MG tablet Take 40 mg by mouth at bedtime.       . TOPROL XL 25 MG 24 hr tablet TAKE (1/2) TABLET DAILY.  15 each  6    Physical Examination  Filed Vitals:   06/01/12 0940  BP: 139/83  Pulse: 64  Resp: 16    Body mass index is 27.65 kg/(m^2).  General:  WDWN in NAD Gait: Normal HEENT: WNL Eyes: Pupils equal Pulmonary: normal non-labored breathing , without Rales, rhonchi,  wheezing Cardiac: RRR, without  Murmurs, rubs or gallops; Abdomen: soft, NT, no  masses Skin: no rashes, ulcers noted  Vascular Exam Pulses: Per Dr. Hart Rochester the patient has palpable femoral pulses, there is no abdominal pulsatile mass palpated Carotid bruits: Carotid pulses to auscultation no bruits are heard Extremities without ischemic changes, no Gangrene , no cellulitis; no open wounds;  Musculoskeletal: no muscle wasting or atrophy   Neurologic: A&O X 3; Appropriate Affect ; SENSATION: normal; MOTOR FUNCTION:  moving all extremities equally. Speech is fluent/normal  Non-Invasive Vascular Imaging AAA duplex shows a maximum diameter day of 4.5 AP by 4.5 transverse which is diminished from January 2013 when he was 4.9. There may be a endoleak observed via the duplex today and there could be a possible fracture in the stent however Dr. Hart Rochester explained to the patient and his wife this would not be verified until a CT scan is obtained.  ASSESSMENT/PLAN: Asymptomatic patient with possible endoleak. Per Dr. Hart Rochester the patient will have a CT in the next 2-3 weeks of the endograft repair and return to see him in his clinic. The patient and his wife are in agreement with this, their questions were encouraged and answered.  Lauree Chandler ANP   Clinic MD: Hart Rochester

## 2012-06-04 ENCOUNTER — Other Ambulatory Visit: Payer: Self-pay

## 2012-06-08 MED ORDER — METOPROLOL SUCCINATE ER 25 MG PO TB24: 12.5000 mg | ORAL_TABLET | Freq: Every day | ORAL | Status: DC

## 2012-06-15 ENCOUNTER — Ambulatory Visit: Payer: Medicare Other | Admitting: Vascular Surgery

## 2012-06-21 ENCOUNTER — Encounter: Payer: Self-pay | Admitting: Vascular Surgery

## 2012-06-22 ENCOUNTER — Ambulatory Visit: Payer: Medicare Other | Admitting: Vascular Surgery

## 2012-06-22 ENCOUNTER — Inpatient Hospital Stay: Admission: RE | Admit: 2012-06-22 | Payer: Medicare Other | Source: Ambulatory Visit

## 2012-07-05 ENCOUNTER — Encounter: Payer: Self-pay | Admitting: Vascular Surgery

## 2012-07-06 ENCOUNTER — Ambulatory Visit (INDEPENDENT_AMBULATORY_CARE_PROVIDER_SITE_OTHER): Payer: Medicare Other | Admitting: Vascular Surgery

## 2012-07-06 ENCOUNTER — Encounter: Payer: Self-pay | Admitting: Vascular Surgery

## 2012-07-06 ENCOUNTER — Ambulatory Visit
Admission: RE | Admit: 2012-07-06 | Discharge: 2012-07-06 | Disposition: A | Discharge status: Home / Self Care | Payer: Medicare Other | Source: Ambulatory Visit | Attending: Vascular Surgery | Admitting: Vascular Surgery

## 2012-07-06 VITALS — BP 158/96 | HR 76 | Resp 20 | Ht 73.0 in | Wt 208.0 lb

## 2012-07-06 DIAGNOSIS — I714 Abdominal aortic aneurysm, without rupture, unspecified: Secondary | ICD-10-CM

## 2012-07-06 DIAGNOSIS — Z48812 Encounter for surgical aftercare following surgery on the circulatory system: Secondary | ICD-10-CM

## 2012-07-06 DIAGNOSIS — I2510 Atherosclerotic heart disease of native coronary artery without angina pectoris: Secondary | ICD-10-CM

## 2012-07-06 MED ORDER — IOHEXOL 350 MG/ML SOLN
80.0000 mL | Freq: Once | INTRAVENOUS | Status: AC | PRN
  Administered 2012-07-06: 80 mL via INTRAVENOUS

## 2012-07-06 NOTE — Progress Notes (Signed)
Subjective:     Patient ID: [name], male   DOB: 1927-09-20, 79 y.o.   MRN: [id number]  HPI this 79 year old male returns today for continued followup regarding his aortic stent graft repair performed by Dr. [name] in 2008. He visited the office a few weeks ago an ultrasound suggested a possible endoleak. The sac size of the aneurysm has continued to slowly decrease to 4.5 cm. Today he had CT angiogram performed which I have reviewed by computer. He has had no abnormal or back symptoms.  Past Medical History  Diagnosis Date  . CAD (coronary artery disease)     DES x4 RCA, DES LAD,  /    catheterization January, 2008, nonobstructive disease  . Cough     December, 2011  . Hypertension   . Dyslipidemia   . Carotid bruit     Doppler, no significant stenosis  . AAA (abdominal aortic aneurysm)     Stent graft  . Colon polyps   . Depression     Situational depression with loss of family members  . 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 Hyperlipidemia   . Myocardial infarction 2009  . Leg pain     History  Substance Use Topics  . Smoking status: Former Smoker -- 0.80 packs/day for 10 years    Types: Cigarettes    Quit date: 05/26/1989  . Smokeless tobacco: Never Used  . Alcohol Use: No    Family History  Problem Relation Age of Onset  . Heart failure Mother   . Coronary artery disease Mother   . Stroke Father   . Coronary artery disease Sister     No Known Allergies  Current outpatient prescriptions:amLODipine (NORVASC) 10 MG tablet, Take 1 tablet (10 mg total) by mouth daily., Disp: 30 tablet, Rfl: 2;  aspirin 81 MG tablet, Take 81 mg by mouth daily.  , Disp: , Rfl: ;  clopidogrel (PLAVIX) 75 MG tablet, Take 75 mg by mouth daily.  , Disp: , Rfl: ;  dextromethorphan-guaiFENesin (MUCINEX DM) 30-600 MG per 12 hr tablet, Take 1 tablet by mouth every 12 (twelve) hours., Disp: , Rfl:  metoprolol succinate (TOPROL XL) 25 MG 24 hr tablet, Take 0.5 tablets  (12.5 mg total) by mouth daily., Disp: 15 tablet, Rfl: 1;  nitroGLYCERIN (NITROSTAT) 0.4 MG SL tablet, Place 1 tablet (0.4 mg total) under the tongue every 5 (five) minutes as needed., Disp: 25 tablet, Rfl: 3;  rosuvastatin (CRESTOR) 20 MG tablet, Take 10 mg by mouth daily.  , Disp: , Rfl:  ALPRAZolam (XANAX) 0.25 MG tablet, Take 0.25 mg by mouth at bedtime as needed.  , Disp: , Rfl: ;  famotidine (PEPCID) 20 MG tablet, Take 1 tablet (20 mg total) by mouth 2 (two) times daily., Disp: 60 tablet, Rfl: 6;  simvastatin (ZOCOR) 80 MG tablet, Take 40 mg by mouth at bedtime. , Disp: , Rfl: ;  zolpidem (AMBIEN) 10 MG tablet, Take 10 mg by mouth at bedtime as needed.  , Disp: , Rfl:   BP 158/96  Pulse 76  Resp 20  Ht 6\' 1"  (1.854 m)  Wt 208 lb (94.348 kg)  BMI 27.45 kg/m2  Body mass index is 27.45 kg/(m^2).           Review of SystemsDenies chest pain, dyspnea on exertion, PND, orthopnea, claudication     Objective:   Physical Exam blood pressure 150/96 heart rate 76  respirations 20 General well-developed well-nourished elderly male in no apparent stress alert and oriented x3 Chest no rhonchi or wheezing Cardiovascular regular rhythm no murmurs Abdomen soft nontender no pulse, and is noted 3+ femoral 2+ posterior tibial pulses palpable  Today I ordered a CT angiogram which are reviewed by computer. There is no obvious evidence of any endoleak certainly not a type I. Aneurysm sac continues to be about 4.5 cm in maximum diameter. No evidence of stent fracture or migration of the graft.      Assessment:     Doing well 6 years post EVAR for AAA by Dr. Georganna Skeans no definite evidence of Endo leak -stable sac size by CT angioderm     Plan:     Return in one year with duplex scan for continued followup Duplex scan this year revealed possible endoleak which was not confirmed by CT angiogram

## 2012-07-07 NOTE — Addendum Note (Signed)
Addended by: Sharee Pimple on: 07/07/2012 10:18 AM   Modules accepted: Orders

## 2012-07-20 ENCOUNTER — Other Ambulatory Visit: Payer: Self-pay

## 2012-07-21 MED ORDER — AMLODIPINE BESYLATE 10 MG PO TABS: 10.0000 mg | ORAL_TABLET | Freq: Every day | ORAL | Status: DC

## 2012-07-23 ENCOUNTER — Other Ambulatory Visit: Payer: Self-pay

## 2012-07-23 MED ORDER — AMLODIPINE BESYLATE 10 MG PO TABS: 10.0000 mg | ORAL_TABLET | Freq: Every day | ORAL | Status: DC

## 2012-07-29 ENCOUNTER — Other Ambulatory Visit: Payer: Self-pay

## 2012-07-29 MED ORDER — METOPROLOL SUCCINATE ER 25 MG PO TB24: 12.5000 mg | ORAL_TABLET | Freq: Every day | ORAL | Status: DC

## 2012-08-24 ENCOUNTER — Telehealth: Payer: Self-pay | Admitting: *Deleted

## 2012-08-24 NOTE — Telephone Encounter (Signed)
BLE swelling.  States he always has some swelling but has noticed an increase in swelling in R anterior lower leg over shin.  Pitting edema noted and there is also a bruise where he bumped his leg and slight redness around the bruise.  Redness improves when leg is elevated.  Offered appt in after hours clinic but prefers to see Bennie Pierini, FNP.  Appt scheduled for 08/26/12. He will call or return to clinic if symptoms worsen.  Instructed him to elevated legs to help with swelling.

## 2012-08-25 ENCOUNTER — Encounter: Payer: Self-pay | Admitting: General Practice

## 2012-08-25 ENCOUNTER — Ambulatory Visit (INDEPENDENT_AMBULATORY_CARE_PROVIDER_SITE_OTHER): Payer: Medicare Other | Admitting: General Practice

## 2012-08-25 VITALS — BP 127/66 | HR 67 | Temp 97.9°F | Ht 72.5 in | Wt 213.0 lb

## 2012-08-25 DIAGNOSIS — L03119 Cellulitis of unspecified part of limb: Secondary | ICD-10-CM

## 2012-08-25 DIAGNOSIS — L03116 Cellulitis of left lower limb: Secondary | ICD-10-CM

## 2012-08-25 MED ORDER — CEPHALEXIN 500 MG PO CAPS: 500.0000 mg | ORAL_CAPSULE | Freq: Three times a day (TID) | ORAL | Status: DC

## 2012-08-25 NOTE — Progress Notes (Signed)
  Subjective:    Patient ID: [name], male    DOB: Oct 08, 1927, 79 y.o.   MRN: [id number]  HPI Presents today with small raised area to left lower leg, onset 1-2 weeks ago. Reports area became  Painful yesterday, rated at 2 on 1-10 scale. Reports pain was worse lastnight, rated at 4 on 1-10 scale. Denies known trauma to left lower leg. Reports he may have bumped his leg on something, but unsure. Reports apply neosporin on affected area. Denies drainage at any time.    Review of Systems  Constitutional: Negative for fever and chills.  Respiratory: Negative for chest tightness and shortness of breath.   Cardiovascular: Negative for chest pain, palpitations and leg swelling.  Genitourinary: Negative for difficulty urinating.  Skin:       Left lower leg (shin area) slightly tender to touch and warm  Psychiatric/Behavioral: Negative.        Objective:   Physical Exam  Constitutional: He is oriented to person, place, and time. He appears well-developed and well-nourished.  Cardiovascular: Normal rate, regular rhythm, normal heart sounds and intact distal pulses.   No murmur heard. Positive 1+ bilateral pedal pulses  Pulmonary/Chest: Effort normal and breath sounds normal.  Neurological: He is alert and oriented to person, place, and time.  Skin: Skin is warm and dry. No rash noted. There is erythema.  Left lower leg, slightly erythematous, slightly warm to touch, 1 inch x1 inch area. Skin completely intact  Psychiatric: He has a normal mood and affect.          Assessment & Plan:  Continue antibiotics even if feeling better Keep clean and dry  Raymon Mutton, FNP-C

## 2012-08-25 NOTE — Patient Instructions (Signed)
Cellulitis Cellulitis is an infection of the skin and the tissue beneath it. The infected area is usually red and tender. Cellulitis occurs most often in the arms and lower legs.   CAUSES   Cellulitis is caused by bacteria that enter the skin through cracks or cuts in the skin. The most common types of bacteria that cause cellulitis are Staphylococcus and Streptococcus. SYMPTOMS    Redness and warmth.   Swelling.   Tenderness or pain.   Fever.  DIAGNOSIS  Your caregiver can usually determine what is wrong based on a physical exam. Blood tests may also be done. TREATMENT   Treatment usually involves taking an antibiotic medicine. HOME CARE INSTRUCTIONS    Take your antibiotics as directed. Finish them even if you start to feel better.   Keep the infected arm or leg elevated to reduce swelling.   Apply a warm cloth to the affected area up to 4 times per day to relieve pain.   Only take over-the-counter or prescription medicines for pain, discomfort, or fever as directed by your caregiver.   Keep all follow-up appointments as directed by your caregiver.  SEEK MEDICAL CARE IF:    You notice red streaks coming from the infected area.   Your red area gets larger or turns dark in color.   Your bone or joint underneath the infected area becomes painful after the skin has healed.   Your infection returns in the same area or another area.   You notice a swollen bump in the infected area.   You develop new symptoms.  SEEK IMMEDIATE MEDICAL CARE IF:    You have a fever.   You feel very sleepy.   You develop vomiting or diarrhea.   You have a general ill feeling (malaise) with muscle aches and pains.  MAKE SURE YOU:    Understand these instructions.   Will watch your condition.   Will get help right away if you are not doing well or get worse.  Document Released: 02/19/2005 Document Revised: 11/11/2011 Document Reviewed: 07/28/2011 [employer] Patient Information 2013  [employer]    

## 2012-08-26 ENCOUNTER — Ambulatory Visit: Payer: Self-pay | Admitting: Nurse Practitioner

## 2012-09-23 ENCOUNTER — Other Ambulatory Visit: Payer: Self-pay

## 2012-09-23 MED ORDER — METOPROLOL SUCCINATE ER 25 MG PO TB24: 12.5000 mg | ORAL_TABLET | Freq: Every day | ORAL | Status: DC

## 2012-10-26 ENCOUNTER — Ambulatory Visit: Payer: Medicare Other

## 2012-11-01 ENCOUNTER — Other Ambulatory Visit: Payer: Self-pay | Admitting: Cardiology

## 2012-11-03 ENCOUNTER — Other Ambulatory Visit: Payer: Self-pay | Admitting: *Deleted

## 2012-11-03 MED ORDER — CYANOCOBALAMIN 1000 MCG/ML IJ SOLN: INTRAMUSCULAR | Status: DC

## 2012-11-03 NOTE — Telephone Encounter (Signed)
LAST INJECTION IN OFFICE 05/31/12. TRIED TO CALL PT AND SEE IF GIVING IT AT HOME, NA.

## 2012-11-04 ENCOUNTER — Other Ambulatory Visit: Payer: Self-pay | Admitting: *Deleted

## 2012-11-04 MED ORDER — METOPROLOL SUCCINATE ER 25 MG PO TB24: 12.5000 mg | ORAL_TABLET | Freq: Every day | ORAL | Status: AC

## 2012-11-04 MED ORDER — AMLODIPINE BESYLATE 10 MG PO TABS: 10.0000 mg | ORAL_TABLET | Freq: Every day | ORAL | Status: DC

## 2012-12-08 ENCOUNTER — Encounter: Payer: Self-pay | Admitting: Cardiology

## 2012-12-09 ENCOUNTER — Ambulatory Visit (INDEPENDENT_AMBULATORY_CARE_PROVIDER_SITE_OTHER): Payer: Medicare Other | Admitting: Cardiology

## 2012-12-09 ENCOUNTER — Encounter: Payer: Self-pay | Admitting: Cardiology

## 2012-12-09 VITALS — BP 109/67 | HR 64 | Ht 73.0 in | Wt 215.8 lb

## 2012-12-09 DIAGNOSIS — R0989 Other specified symptoms and signs involving the circulatory and respiratory systems: Secondary | ICD-10-CM

## 2012-12-09 DIAGNOSIS — I2510 Atherosclerotic heart disease of native coronary artery without angina pectoris: Secondary | ICD-10-CM

## 2012-12-09 DIAGNOSIS — I10 Essential (primary) hypertension: Secondary | ICD-10-CM

## 2012-12-09 DIAGNOSIS — I714 Abdominal aortic aneurysm, without rupture: Secondary | ICD-10-CM

## 2012-12-09 NOTE — Progress Notes (Signed)
HPI  Patient is seen today to followup coronary disease. He is doing well. He had an intervention in the past. He had a nuclear study in 2012 showing no significant ischemia. In the past his abdominal aortic aneurysm was treated with a stent. Earlier this year the ultrasound raised the question of an endoleak. He had a followup CT and there was no significant abnormality. He is followed very carefully by Dr. Hart Rochester. He is active with no significant symptoms.  No Known Allergies  Current Outpatient Prescriptions  Medication Sig Dispense Refill  . amLODipine (NORVASC) 10 MG tablet Take 1 tablet (10 mg total) by mouth daily.  30 tablet  1  . aspirin 81 MG tablet Take 81 mg by mouth daily.        . clopidogrel (PLAVIX) 75 MG tablet Take 75 mg by mouth daily.        . cyanocobalamin 100 MCG tablet Take 100 mcg by mouth daily.      . metoprolol succinate (TOPROL XL) 25 MG 24 hr tablet Take 0.5 tablets (12.5 mg total) by mouth daily.  15 tablet  1  . nitroGLYCERIN (NITROSTAT) 0.4 MG SL tablet Place 1 tablet (0.4 mg total) under the tongue every 5 (five) minutes as needed.  25 tablet  3  . rosuvastatin (CRESTOR) 20 MG tablet Take 10 mg by mouth daily.         No current facility-administered medications for this visit.    History   Social History  . Marital Status: Widowed    Spouse Name: N/A    Number of Children: N/A  . Years of Education: N/A   Occupational History  . Not on file.   Social History Main Topics  . Smoking status: Former Smoker -- 0.80 packs/day for 10 years    Types: Cigarettes    Quit date: 05/26/1989  . Smokeless tobacco: Never Used  . Alcohol Use: No  . Drug Use: No  . Sexually Active: Not on file   Other Topics Concern  . Not on file   Social History Narrative   Retired Printmaker    Family History  Problem Relation Age of Onset  . Heart failure Mother   . Coronary artery disease Mother   . Stroke Father   . Coronary artery disease Sister      Past Medical History  Diagnosis Date  . CAD (coronary artery disease)     DES x4 RCA, DES LAD,  /    catheterization January, 2008, nonobstructive disease  . Cough     December, 2011  . Hypertension   . Dyslipidemia   . Carotid bruit     Doppler, no significant stenosis  . AAA (abdominal aortic aneurysm)     Stent graft  . Colon polyps   . Depression     Situational depression with loss of family members  . 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 Hyperlipidemia   . Myocardial infarction 2009  . Leg pain     Past Surgical History  Procedure Laterality Date  . Argioplasty    . Abdominal aortic aneurysm repair  03/22/2007  . Cholecystectomy  2005    Patient Active Problem List   Diagnosis Date Noted  . Carotid bruit   . Elevated bilirubin   . CAD (coronary artery disease)   . Hypertension   . Dyslipidemia   . AAA (abdominal aortic aneurysm)   .  Depression     ROS   Patient denies fever, chills, headache, sweats, rash, change in vision, change in hearing, chest pain, cough, nausea vomiting, urinary symptoms. All other systems are reviewed and are negative.  PHYSICAL EXAM  Patient is oriented to person time and place. Affect is normal. He has mild ptosis of the right eyelid. There is no jugulovenous distention. Lungs are clear. Respiratory effort is nonlabored. Cardiac exam reveals S1 and S2. There are no clicks or significant murmurs. The abdomen is soft. There is no peripheral edema. He has a few areas of ecchymosis. There are no skin rashes. There no musculoskeletal deformities.  Filed Vitals:   12/09/12 1339  BP: 109/67  Pulse: 64  Height: 6\' 1"  (1.854 m)  Weight: 215 lb 12.8 oz (97.886 kg)   EKG is done today and reviewed by me. The EKG is normal with no change.  ASSESSMENT & PLAN

## 2012-12-09 NOTE — Assessment & Plan Note (Signed)
Coronary disease is stable. I have reviewed all of his prior information. His drug-eluting stent was 2008. His nuclear study in 2012 revealed slight peri-infarct ischemia. He has been on aspirin and Plavix. It is now time to stop Plavix as there is no definite evidence that it is beneficial for him going forward. There is some increased bleeding risk.

## 2012-12-09 NOTE — Assessment & Plan Note (Signed)
The patient has had complete reevaluation of his stent for his abdominal aortic aneurysm. There is no leak and he is followed by Dr. Hart Rochester.

## 2012-12-09 NOTE — Patient Instructions (Addendum)
Your physician recommends that you schedule a follow-up appointment in: 1 year. You will receive a reminder letter in the mail in about 10 months reminding you to call and schedule your appointment. If you don't receive this letter, please contact our office. Your physician has recommended you make the following change in your medication: Stop plavix. All other medications will remain the same.

## 2012-12-09 NOTE — Assessment & Plan Note (Signed)
Blood pressure is controlled. No change in therapy.  As part of today's evaluation I spent greater than 25 minutes with his total care. More than half of his care has been with direct contact with him discussing all issues including the reason for the inability to stop Plavix at this time.

## 2012-12-09 NOTE — Assessment & Plan Note (Signed)
There was question of a carotid bruit in the past. He had a Doppler in the past with no significant stenosis. I do not hear a bruit now. No further workup at this time.

## 2013-01-13 ENCOUNTER — Other Ambulatory Visit: Payer: Self-pay | Admitting: Cardiology

## 2013-07-12 ENCOUNTER — Ambulatory Visit: Payer: Medicare Other | Admitting: Vascular Surgery

## 2013-07-12 ENCOUNTER — Other Ambulatory Visit (HOSPITAL_COMMUNITY): Payer: Medicare Other

## 2013-08-08 ENCOUNTER — Encounter: Payer: Self-pay | Admitting: Vascular Surgery

## 2013-08-09 ENCOUNTER — Ambulatory Visit (HOSPITAL_COMMUNITY)
Admission: RE | Admit: 2013-08-09 | Discharge: 2013-08-09 | Disposition: A | Discharge status: Home / Self Care | Payer: Medicare Other | Source: Ambulatory Visit | Attending: Vascular Surgery | Admitting: Vascular Surgery

## 2013-08-09 ENCOUNTER — Encounter: Payer: Self-pay | Admitting: Vascular Surgery

## 2013-08-09 ENCOUNTER — Ambulatory Visit (INDEPENDENT_AMBULATORY_CARE_PROVIDER_SITE_OTHER): Payer: Medicare Other | Admitting: Vascular Surgery

## 2013-08-09 VITALS — BP 132/79 | HR 55 | Ht 73.0 in | Wt 221.0 lb

## 2013-08-09 DIAGNOSIS — I714 Abdominal aortic aneurysm, without rupture, unspecified: Secondary | ICD-10-CM | POA: Insufficient documentation

## 2013-08-09 DIAGNOSIS — Z48812 Encounter for surgical aftercare following surgery on the circulatory system: Secondary | ICD-10-CM

## 2013-08-09 NOTE — Progress Notes (Signed)
Subjective:     Patient ID: [name], male   DOB: 07/12/1927, 80 y.o.   MRN: [id number]  HPI this 80 year old male returns for followup regarding his aortic stent graft placed by Dr. Liliane BadeGreg Hayes in 2008. Patient denies any new abdominal or back pain. He does have some chronic back discomfort which radiates pain in both legs with ambulation and at rest at night. He denies any new symptoms since last year.   Past Medical History  Diagnosis Date  . CAD (coronary artery disease)     DES x4 RCA, DES LAD,  /    catheterization January, 2008, nonobstructive disease  . Cough     December, 2011  . Hypertension   . Dyslipidemia   . Carotid bruit     Doppler, no significant stenosis  . AAA (abdominal aortic aneurysm)     Stent graft  . Colon polyps   . Depression     Situational depression with loss of family members  . 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 Hyperlipidemia   . Myocardial infarction 2009  . Leg pain     History  Substance Use Topics  . Smoking status: Former Smoker -- 0.80 packs/day for 10 years    Types: Cigarettes    Quit date: 05/26/1989  . Smokeless tobacco: Never Used  . Alcohol Use: No    Family History  Problem Relation Age of Onset  . Heart failure Mother   . Coronary artery disease Mother   . Heart disease Mother     before age 78  . Hyperlipidemia Mother   . Hypertension Mother   . Stroke Father   . Hypertension Father   . Coronary artery disease Sister   . Hypertension Sister   . Hyperlipidemia Sister     No Known Allergies  Current outpatient prescriptions:amLODipine (NORVASC) 10 MG tablet, TAKE 1 TABLET DAILY, Disp: 30 tablet, Rfl: 6;  aspirin 81 MG tablet, Take 81 mg by mouth daily.  , Disp: , Rfl: ;  cyanocobalamin 100 MCG tablet, Take 100 mcg by mouth daily., Disp: , Rfl: ;  metoprolol succinate (TOPROL XL) 25 MG 24 hr tablet, Take 0.5 tablets (12.5 mg total) by mouth daily., Disp: 15 tablet, Rfl: 1 nitroGLYCERIN  (NITROSTAT) 0.4 MG SL tablet, Place 1 tablet (0.4 mg total) under the tongue every 5 (five) minutes as needed., Disp: 25 tablet, Rfl: 3;  rosuvastatin (CRESTOR) 20 MG tablet, Take 10 mg by mouth daily.  , Disp: , Rfl:   BP 132/79  Pulse 55  Ht 6\' 1"  (1.854 m)  Wt 221 lb (100.245 kg)  BMI 29.16 kg/m2  SpO2 98%  Body mass index is 29.16 kg/(m^2).         Review of Systems denies chest pain, dyspnea on exertion, PND, orthopnea. Does have swelling in legs. Thigh discomfort radiating from back. All of systems negative and a complete review of systems     Objective:   Physical Exam BP 132/79  Pulse 55  Ht 6\' 1"  (1.854 m)  Wt 221 lb (100.245 kg)  BMI 29.16 kg/m2  SpO2 98%  Gen.-alert and oriented x3 in no apparent distress HEENT normal for age Lungs no rhonchi or wheezing Cardiovascular regular rhythm no murmurs carotid pulses 3+ palpable no bruits audible Abdomen soft nontender no palpable masses Musculoskeletal free of  major deformities Skin clear -no rashes Neurologic normal Lower extremities 3+ femoral and  dorsalis pedis pulses palpable bilaterally with no edema  Today I ordered a duplex scan of his abdominal aortic stent graft. Which I reviewed and interpreted. Diameter appears to be about 4.8 cm similar to previous scan done in January of 2014. No endoleak is noted.       Assessment:     Status post aortic stent graft for abdominal aortic aneurysm in 2008 Dr. Elisabeth Most evidence of endoleak or expansion of aneurysm    Plan:     Return in one year with a CT angiogram of the aortic aneurysm stent graft repair

## 2013-08-10 NOTE — Addendum Note (Signed)
Addended by: Sharee PimpleMCCHESNEY, Autumn Pruitt K on: 08/10/2013 10:45 AM   Modules accepted: Orders

## 2013-11-16 ENCOUNTER — Telehealth: Payer: Self-pay | Admitting: Cardiology

## 2013-11-16 NOTE — Telephone Encounter (Signed)
Closed by accident

## 2013-11-16 NOTE — Telephone Encounter (Signed)
Patient's wife called asking for patient to be seen due to leg swelling  Dr Henrietta HooverKatz's first available is in late Aug.   Please contact patient in reference to need of being seen sooner

## 2013-11-17 NOTE — Telephone Encounter (Signed)
Patient noticed on Sunday that his feet and ankles were swollen and also having a lot of pain. Patient saw his PCP yesterday and had x-ray and lab work ordered. Patient called to see if he could be seen at our office before July or August. Nurse advised patient that he could be given a sooner appointment with a different provider. Patient agreed and appointment given for next week.

## 2013-11-22 ENCOUNTER — Ambulatory Visit (INDEPENDENT_AMBULATORY_CARE_PROVIDER_SITE_OTHER): Payer: Medicare Other | Admitting: Cardiology

## 2013-11-22 ENCOUNTER — Encounter: Payer: Self-pay | Admitting: Cardiology

## 2013-11-22 VITALS — BP 120/70 | HR 62 | Ht 73.0 in | Wt 222.0 lb

## 2013-11-22 DIAGNOSIS — M79605 Pain in left leg: Secondary | ICD-10-CM

## 2013-11-22 DIAGNOSIS — I2510 Atherosclerotic heart disease of native coronary artery without angina pectoris: Secondary | ICD-10-CM

## 2013-11-22 DIAGNOSIS — R609 Edema, unspecified: Secondary | ICD-10-CM

## 2013-11-22 DIAGNOSIS — R600 Localized edema: Secondary | ICD-10-CM

## 2013-11-22 DIAGNOSIS — M79609 Pain in unspecified limb: Secondary | ICD-10-CM

## 2013-11-22 MED ORDER — NITROGLYCERIN 0.4 MG SL SUBL: 0.4000 mg | SUBLINGUAL_TABLET | SUBLINGUAL | Status: AC | PRN

## 2013-11-22 MED ORDER — FUROSEMIDE 20 MG PO TABS: 20.0000 mg | ORAL_TABLET | Freq: Every day | ORAL | Status: DC | PRN

## 2013-11-22 NOTE — Progress Notes (Signed)
Clinical Summary [name] is a 80 y.o.male regular patient of Dr Myrtis SerKatz, he is seen today as an add on patient for LE edema.   1. CAD - hx of prior stenting as described below, last cath 10/2006 with patent stents and non-obstructive disease. LVEF 60% by LV gram. Do not see recent echo in system - denies any recent chest pain  2. AAA - hx of repair - followed by vascular  3. LE edema X 6 months. DOE at 3-4 blocks. No orthopnea though uses CPAP, no PND. + weight gain Past Medical History  Diagnosis Date  . CAD (coronary artery disease)     DES x4 RCA, DES LAD,  /    catheterization January, 2008, nonobstructive disease  . Cough     December, 2011  . Hypertension   . Dyslipidemia   . Carotid bruit     Doppler, no significant stenosis  . AAA (abdominal aortic aneurysm)     Stent graft  . Colon polyps   . Depression     Situational depression with loss of family members  . 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 Hyperlipidemia   . Myocardial infarction 2009  . Leg pain      No Known Allergies   Current Outpatient Prescriptions  Medication Sig Dispense Refill  . amLODipine (NORVASC) 10 MG tablet TAKE 1 TABLET DAILY  30 tablet  6  . aspirin 81 MG tablet Take 81 mg by mouth daily.        . cyanocobalamin 100 MCG tablet Take 100 mcg by mouth daily.      . metoprolol succinate (TOPROL XL) 25 MG 24 hr tablet Take 0.5 tablets (12.5 mg total) by mouth daily.  15 tablet  1  . nitroGLYCERIN (NITROSTAT) 0.4 MG SL tablet Place 1 tablet (0.4 mg total) under the tongue every 5 (five) minutes as needed.  25 tablet  3  . rosuvastatin (CRESTOR) 20 MG tablet Take 10 mg by mouth daily.         No current facility-administered medications for this visit.     Past Surgical History  Procedure Laterality Date  . Argioplasty    . Abdominal aortic aneurysm repair  03/22/2007  . Cholecystectomy  2005     No Known Allergies    Family History  Problem  Relation Age of Onset  . Heart failure Mother   . Coronary artery disease Mother   . Heart disease Mother     before age 78  . Hyperlipidemia Mother   . Hypertension Mother   . Stroke Father   . Hypertension Father   . Coronary artery disease Sister   . Hypertension Sister   . Hyperlipidemia Sister      Social History [name] reports that he quit smoking about 24 years ago. His smoking use included Cigarettes. He has a 8 pack-year smoking history. He has never used smokeless tobacco. [name] reports that he does not drink alcohol.   Review of Systems CONSTITUTIONAL: No weight loss, fever, chills, weakness or fatigue.  HEENT: Eyes: No visual loss, blurred vision, double vision or yellow sclerae.No hearing loss, sneezing, congestion, runny nose or sore throat.  SKIN: No rash or itching.  CARDIOVASCULAR:per HPI RESPIRATORY: No shortness of breath, cough or sputum.  GASTROINTESTINAL: No anorexia, nausea, vomiting or diarrhea. No abdominal pain or blood.  GENITOURINARY: No burning on urination, no  polyuria NEUROLOGICAL: No headache, dizziness, syncope, paralysis, ataxia, numbness or tingling in the extremities. No change in bowel or bladder control.  MUSCULOSKELETAL: leg edema LYMPHATICS: No enlarged nodes. No history of splenectomy.  PSYCHIATRIC: No history of depression or anxiety.  ENDOCRINOLOGIC: No reports of sweating, cold or heat intolerance. No polyuria or polydipsia.  Marland Kitchen.   Physical Examination p 62 bp 120/70 Wt 222 lbs BMI 29 Gen: resting comfortably, no acute distress HEENT: no scleral icterus, pupils equal round and reactive, no palptable cervical adenopathy,  CV: RRR, no m/r/g, no JVD Resp: Clear to auscultation bilaterally GI: abdomen is soft, non-tender, non-distended, normal bowel sounds, no hepatosplenomegaly MSK: extremities are warm, 1+ bilateral edema Skin: warm, no rash Neuro:  no focal deficits Psych: appropriate affect   Diagnostic  Studies 10/2006 Cath RESULTS: The aortic pressure is 137/71 with a mean of 97, left  ventricular pressure was 137/19.  The left main coronary artery was free of significant disease.  The left anterior descending artery gave rise to a small and large  diagonal branch, two septal perforators, and two more diagonal branches.  There was 0% stenosis at the stent site in the midportion of the vessel.  There was no other significant disease.  The circumflex artery gave rise to a ramus branch, a marginal branch,  and a posterolateral branch. These vessels were free of significant  disease.  The right coronary artery was a moderately large vessel that gave rise  to conus branch, a right ventricular branch, a posterior descending  branch, and two posterolateral branches. There was less than 10%  narrowing at the stent in the proximal right coronary. There was also  less than 10% stenosis at the overlapping stents in the mid and distal  right coronary artery. Distal vessel is free of major obstruction.  LEFT VENTRICULOGRAM: Was performed in the RAO projection. It showed  good wall motion with no areas of hypokinesis. The estimated ejection  fraction was 60%.  DISTAL AORTOGRAM: Was performed which showed patent renal arteries.  There was a large well localized aneurysm in the distal aorta to the  bifurcation. This arose well below the renals.  CONCLUSION:  1. Coronary artery disease, status post prior previous percutaneous  coronary interventions as described above with 0% stenosis at the  stent site in the mid left anterior descending artery, no  significant obstruction of circumflex artery, less than 10%  stenosis at the stent site in the proximal right coronary artery,  and less than 10% stenosis at the overlapping stents in the mid-to-  distal right coronary artery, with 30% proximal stenosis in the  right coronary artery, and normal left ventricular function.   2. Large abdominal aortic  aneurysm which is 4.8 by last ultrasound.  RECOMMENDATIONS: All of [name]'s stents are patent. There does not  appear to be any obstructive coronary disease. The etiology of his  symptoms is not clear. I will discuss this further with him and decided  if we need to do any further evaluation. We will plan discharge later  today.     Assessment and Plan  1. CAD - no current symptoms, continue risk factor modificaiton  2. AAA - continue f/ with vacular  3. Leg edema - check BMET, check echo in the setting of known CAD - start lasix 20mg  daily    F/u Dr Hollice GongKatz  Jonathan F. Branch, M.D., F.A.C.C.

## 2013-11-22 NOTE — Patient Instructions (Signed)
Your physician has requested that you have an echocardiogram. Echocardiography is a painless test that uses sound waves to create images of your heart. It provides your doctor with information about the size and shape of your heart and how well your heart's chambers and valves are working. This procedure takes approximately one hour. There are no restrictions for this procedure. [facility]  Office will contact with results via phone or letter.   Lasix 20mg  daily as needed leg swelling - new sent to pharm Continue all other medications.   Dr. Hart RochesterLawson - this week if possible for leg pain  Follow up in  3-4 weeks

## 2013-12-05 ENCOUNTER — Other Ambulatory Visit: Payer: Self-pay | Admitting: *Deleted

## 2013-12-05 ENCOUNTER — Encounter: Payer: Self-pay | Admitting: Vascular Surgery

## 2013-12-05 DIAGNOSIS — M7989 Other specified soft tissue disorders: Secondary | ICD-10-CM

## 2013-12-06 ENCOUNTER — Encounter: Payer: Self-pay | Admitting: Vascular Surgery

## 2013-12-06 ENCOUNTER — Ambulatory Visit (HOSPITAL_COMMUNITY)
Admission: RE | Admit: 2013-12-06 | Discharge: 2013-12-06 | Disposition: A | Discharge status: Home / Self Care | Payer: Medicare Other | Source: Ambulatory Visit | Attending: Vascular Surgery | Admitting: Vascular Surgery

## 2013-12-06 ENCOUNTER — Ambulatory Visit (INDEPENDENT_AMBULATORY_CARE_PROVIDER_SITE_OTHER): Payer: Self-pay | Admitting: Vascular Surgery

## 2013-12-06 VITALS — BP 125/68 | HR 58 | Temp 97.7°F | Resp 16 | Ht 73.0 in | Wt 218.0 lb

## 2013-12-06 DIAGNOSIS — M79669 Pain in unspecified lower leg: Secondary | ICD-10-CM

## 2013-12-06 DIAGNOSIS — R600 Localized edema: Secondary | ICD-10-CM | POA: Insufficient documentation

## 2013-12-06 DIAGNOSIS — M79609 Pain in unspecified limb: Secondary | ICD-10-CM | POA: Insufficient documentation

## 2013-12-06 DIAGNOSIS — R609 Edema, unspecified: Secondary | ICD-10-CM

## 2013-12-06 DIAGNOSIS — I83893 Varicose veins of bilateral lower extremities with other complications: Secondary | ICD-10-CM

## 2013-12-06 DIAGNOSIS — M7989 Other specified soft tissue disorders: Secondary | ICD-10-CM | POA: Insufficient documentation

## 2013-12-06 NOTE — Progress Notes (Signed)
Subjective:     Patient ID: Barbra SarksBernard Ferrall, male   DOB: 04/11/1928, 80 y.o.   MRN: [id number]  HPI 80 year old male was referred for evaluation of bilateral edema. He is well known to our practice having undergone aortic stent graft in the past by Dr. Liliane BadeGreg Hayes. I saw the patient in March of this year. He states at about 3 weeks ago he began having edema in both lower extremities. He has no history of DVT, thrombophlebitis, stasis ulcers, bleeding, varicose veins, or other vascular problems in the lower extremities. He does not elevate his legs or regular basis nor elastic compression stockings. He does take diuretics.  Past Medical History  Diagnosis Date  . CAD (coronary artery disease)     DES x4 RCA, DES LAD,  /    catheterization January, 2008, nonobstructive disease  . Cough     December, 2011  . Hypertension   . Dyslipidemia   . Carotid bruit     Doppler, no significant stenosis  . AAA (abdominal aortic aneurysm)     Stent graft  . Colon polyps   . Depression     Situational depression with loss of family members  . 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 Hyperlipidemia   . Myocardial infarction 2009  . Leg pain     History  Substance Use Topics  . Smoking status: Former Smoker -- 0.80 packs/day for 10 years    Types: Cigarettes    Quit date: 05/26/1989  . Smokeless tobacco: Never Used  . Alcohol Use: No    Family History  Problem Relation Age of Onset  . Heart failure Mother   . Coronary artery disease Mother   . Heart disease Mother     before age 78  . Hyperlipidemia Mother   . Hypertension Mother   . Stroke Father   . Hypertension Father   . Coronary artery disease Sister   . Hypertension Sister   . Hyperlipidemia Sister     No Known Allergies  Current outpatient prescriptions:amLODipine (NORVASC) 10 MG tablet, TAKE 1 TABLET DAILY, Disp: 30 tablet, Rfl: 6;  aspirin 81 MG tablet, Take 81 mg by mouth daily.  , Disp: , Rfl: ;   atorvastatin (LIPITOR) 20 MG tablet, Take 20 mg by mouth daily., Disp: , Rfl: ;  cyanocobalamin 100 MCG tablet, Take 100 mcg by mouth daily., Disp: , Rfl:  furosemide (LASIX) 20 MG tablet, Take 1 tablet (20 mg total) by mouth daily as needed for edema (leg swelling)., Disp: 30 tablet, Rfl: 3;  meclizine (ANTIVERT) 25 MG tablet, Take 25 mg by mouth 3 (three) times daily as needed for dizziness., Disp: , Rfl: ;  metoprolol succinate (TOPROL XL) 25 MG 24 hr tablet, Take 0.5 tablets (12.5 mg total) by mouth daily., Disp: 15 tablet, Rfl: 1 nitroGLYCERIN (NITROSTAT) 0.4 MG SL tablet, Place 1 tablet (0.4 mg total) under the tongue every 5 (five) minutes as needed., Disp: 25 tablet, Rfl: 3;  traZODone (DESYREL) 50 MG tablet, Take 50 mg by mouth at bedtime., Disp: , Rfl:   BP 125/68  Pulse 58  Temp(Src) 97.7 F (36.5 C) (Oral)  Resp 16  Ht 6\' 1"  (1.854 m)  Wt 218 lb (98.884 kg)  BMI 28.77 kg/m2  SpO2 97%  Body mass index is 28.77 kg/(m^2).           Review of Systems denies chest pain, dyspnea on  exertion, PND, orthopnea, hemoptysis, lateralizing weakness, aphasia, amaurosis fugax. All other systems negative complete review of systems     Objective:   Physical Exam BP 125/68  Pulse 58  Temp(Src) 97.7 F (36.5 C) (Oral)  Resp 16  Ht 6\' 1"  (1.854 m)  Wt 218 lb (98.884 kg)  BMI 28.77 kg/m2  SpO2 97%  Gen.-alert and oriented x3 in no apparent distress HEENT normal for age Lungs no rhonchi or wheezing Cardiovascular regular rhythm no murmurs carotid pulses 3+ palpable no bruits audible Abdomen soft nontender no palpable masses Musculoskeletal free of  major deformities Skin clear -no rashes Neurologic normal Lower extremities 3+ femoral and dorsalis pedis pulses palpable bilaterally with 1+ edema bilaterally. No hyperpigmentation or ulceration noted. No bulging varicosities noted.  Today I ordered bilateral venous duplex exam which I reviewed and interpreted. There is no DVT. There  is no evidence of superficial reflux in the greater or small saphenous veins. There is some deep vein reflux bilaterally particularly in the common femoral veins.        Assessment:     Bilateral lower extremity edema primarily below the knee probably due to mild deep venous reflux-no indication for any vascular intervention not    Plan:     #1 elevate head of bed 3-4 inches #2 short-leg elastic compression stockings 20-30 mm gradient #3 no further suggestions and no further vascular workup indicated-no evidence of arterial insufficiency

## 2013-12-07 ENCOUNTER — Other Ambulatory Visit (INDEPENDENT_AMBULATORY_CARE_PROVIDER_SITE_OTHER): Payer: Medicare Other

## 2013-12-07 ENCOUNTER — Other Ambulatory Visit: Payer: Self-pay

## 2013-12-07 DIAGNOSIS — R609 Edema, unspecified: Secondary | ICD-10-CM

## 2013-12-07 DIAGNOSIS — I2510 Atherosclerotic heart disease of native coronary artery without angina pectoris: Secondary | ICD-10-CM

## 2013-12-07 DIAGNOSIS — I059 Rheumatic mitral valve disease, unspecified: Secondary | ICD-10-CM

## 2013-12-13 ENCOUNTER — Ambulatory Visit (INDEPENDENT_AMBULATORY_CARE_PROVIDER_SITE_OTHER): Payer: Medicare Other | Admitting: Cardiology

## 2013-12-13 ENCOUNTER — Encounter: Payer: Self-pay | Admitting: Cardiology

## 2013-12-13 VITALS — BP 118/75 | HR 55 | Ht 73.0 in | Wt 223.0 lb

## 2013-12-13 DIAGNOSIS — I2510 Atherosclerotic heart disease of native coronary artery without angina pectoris: Secondary | ICD-10-CM

## 2013-12-13 DIAGNOSIS — Z79899 Other long term (current) drug therapy: Secondary | ICD-10-CM

## 2013-12-13 DIAGNOSIS — R609 Edema, unspecified: Secondary | ICD-10-CM

## 2013-12-13 DIAGNOSIS — R600 Localized edema: Secondary | ICD-10-CM

## 2013-12-13 DIAGNOSIS — I10 Essential (primary) hypertension: Secondary | ICD-10-CM

## 2013-12-13 NOTE — Progress Notes (Signed)
Clinical Summary [name] is a 80 y.o.male seen today as a focused visit, follow up for lower extremity edema. He is a regular patient of Dr Myrtis Ser  1. CAD  - hx of prior stenting as described below, last cath 10/2006 with patent stents and non-obstructive disease. - denies any recent chest pain    2 . LE edema  Last visit reported 6 months or worsening LE edema.Also noted some increased DOE - repeat echo after that visit showed stable normal LVEF at 60-65%, grade I diastolic dysfunction - [place] LE 11/2013 ordered by vascular showed no DVT, + evidence of reflux bilaterally.  - we started him on lasix last visit with improved swelling and DOE.   Past Medical History  Diagnosis Date  . CAD (coronary artery disease)     DES x4 RCA, DES LAD,  /    catheterization January, 2008, nonobstructive disease  . Cough     December, 2011  . Hypertension   . Dyslipidemia   . Carotid bruit     Doppler, no significant stenosis  . AAA (abdominal aortic aneurysm)     Stent graft  . Colon polyps   . Depression     Situational depression with loss of family members  . 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 Hyperlipidemia   . Myocardial infarction 2009  . Leg pain      No Known Allergies   Current Outpatient Prescriptions  Medication Sig Dispense Refill  . amLODipine (NORVASC) 10 MG tablet TAKE 1 TABLET DAILY  30 tablet  6  . aspirin 81 MG tablet Take 81 mg by mouth daily.        Marland Kitchen atorvastatin (LIPITOR) 20 MG tablet Take 20 mg by mouth daily.      . cyanocobalamin 100 MCG tablet Take 100 mcg by mouth daily.      . furosemide (LASIX) 20 MG tablet Take 1 tablet (20 mg total) by mouth daily as needed for edema (leg swelling).  30 tablet  3  . meclizine (ANTIVERT) 25 MG tablet Take 25 mg by mouth 3 (three) times daily as needed for dizziness.      . metoprolol succinate (TOPROL XL) 25 MG 24 hr tablet Take 0.5 tablets (12.5 mg total) by mouth daily.  15 tablet  1  .  nitroGLYCERIN (NITROSTAT) 0.4 MG SL tablet Place 1 tablet (0.4 mg total) under the tongue every 5 (five) minutes as needed.  25 tablet  3  . traZODone (DESYREL) 50 MG tablet Take 50 mg by mouth at bedtime.       No current facility-administered medications for this visit.     Past Surgical History  Procedure Laterality Date  . Argioplasty    . Abdominal aortic aneurysm repair  03/22/2007  . Cholecystectomy  2005     No Known Allergies    Family History  Problem Relation Age of Onset  . Heart failure Mother   . Coronary artery disease Mother   . Heart disease Mother     before age 14  . Hyperlipidemia Mother   . Hypertension Mother   . Stroke Father   . Hypertension Father   . Coronary artery disease Sister   . Hypertension Sister   . Hyperlipidemia Sister      Social History [name] reports that he quit smoking about 24 years ago. His smoking use included Cigarettes. He  has a 8 pack-year smoking history. He has never used smokeless tobacco. [name] reports that he does not drink alcohol.   Review of Systems CONSTITUTIONAL: No weight loss, fever, chills, weakness or fatigue.  HEENT: Eyes: No visual loss, blurred vision, double vision or yellow sclerae.No hearing loss, sneezing, congestion, runny nose or sore throat.  SKIN: No rash or itching.  CARDIOVASCULAR: per HPI RESPIRATORY: No shortness of breath, cough or sputum.  GASTROINTESTINAL: No anorexia, nausea, vomiting or diarrhea. No abdominal pain or blood.  GENITOURINARY: No burning on urination, no polyuria NEUROLOGICAL: No headache, dizziness, syncope, paralysis, ataxia, numbness or tingling in the extremities. No change in bowel or bladder control.  MUSCULOSKELETAL: + LE edema  LYMPHATICS: No enlarged nodes. No history of splenectomy.  PSYCHIATRIC: No history of depression or anxiety.  ENDOCRINOLOGIC: No reports of sweating, cold or heat intolerance. No polyuria or polydipsia.  Marland Kitchen.   Physical  Examination p 55 bp 118/75 Wt 223 lbs BMI 29 Gen: resting comfortably, no acute distress HEENT: no scleral icterus, pupils equal round and reactive, no palptable cervical adenopathy,  CV: RRR, no m/r/g, no JVD, no carotid bruits Resp: Clear to auscultation bilaterally GI: abdomen is soft, non-tender, non-distended, normal bowel sounds, no hepatosplenomegaly MSK: extremities are warm, trace bilateral edema Skin: warm, no rash Neuro:  no focal deficits Psych: appropriate affect   Diagnostic Studies  12/07/13 Echo Study Conclusions  - Left ventricle: The cavity size was mildly dilated. Wall thickness was increased in a pattern of mild LVH. Systolic function was normal. The estimated ejection fraction was in the range of 60% to 65%. Wall motion was normal; there were no regional wall motion abnormalities. Doppler parameters are consistent with abnormal left ventricular relaxation (grade 1 diastolic dysfunction). Doppler parameters are consistent with borderline high filling pressure. - Aortic valve: Mildly calcified annulus. Trileaflet; mildly thickened leaflets. - Mitral valve: Mildly thickened leaflets .     Assessment and Plan  1. CAD - no current symptoms, continue risk factor modification and secondary prevention  2. LE edema - echo with only mild diastolic dysfunction, LE US shows evidence of venous reflux - symptoms improved with low dose lasix, continue - will check BMET now that on lasix - if swelling returns, can consider changing to alternative HTN agent other than norvasc, will defer decision to Dr Myrtis SerKatz   Maintain regular follow up with Dr Hollice GongKatz      Skyrah Krupp F. Sha Burling, M.D., F.A.C.C.

## 2013-12-13 NOTE — Patient Instructions (Signed)
   [facility]  Office will contact with results via phone or letter.   Continue all current medications. Cancel Dr. Myrtis SerKatz visit for 12/26/2013 & can move out for 2 months

## 2013-12-26 ENCOUNTER — Ambulatory Visit: Payer: PRIVATE HEALTH INSURANCE | Admitting: Cardiology

## 2014-01-09 ENCOUNTER — Encounter: Payer: Self-pay | Admitting: Cardiology

## 2014-02-20 ENCOUNTER — Encounter: Payer: Self-pay | Admitting: Cardiology

## 2014-02-20 ENCOUNTER — Ambulatory Visit (INDEPENDENT_AMBULATORY_CARE_PROVIDER_SITE_OTHER): Payer: Medicare Other | Admitting: Cardiology

## 2014-02-20 VITALS — BP 107/68 | HR 59 | Ht 73.0 in | Wt 217.0 lb

## 2014-02-20 DIAGNOSIS — R609 Edema, unspecified: Secondary | ICD-10-CM

## 2014-02-20 DIAGNOSIS — I2510 Atherosclerotic heart disease of native coronary artery without angina pectoris: Secondary | ICD-10-CM

## 2014-02-20 DIAGNOSIS — R600 Localized edema: Secondary | ICD-10-CM

## 2014-02-20 DIAGNOSIS — I10 Essential (primary) hypertension: Secondary | ICD-10-CM

## 2014-02-20 NOTE — Assessment & Plan Note (Signed)
Blood pressure today is actually a little lower than usual. He will be following up at home. If his pressure remains on the lower side over time, we will cut back his amlodipine dose. He is not having any current symptoms. No change in therapy at this time.

## 2014-02-20 NOTE — Progress Notes (Signed)
Patient ID: [name], male   DOB: 1928/03/09, 80 y.o.   MRN: [id number]    HPI  Patient is seen today to followup coronary disease and mild edema. He has been seen by Dr. branch since I saw him last. He likes Dr. branch and wants to plan to follow with him after I retire. He had some edema. He is on low-dose diuretic and doing very well with this. No change in therapy. He did have followup labs after his diuretic was started. Renal function and potassium are normal.  No Known Allergies  Current Outpatient Prescriptions  Medication Sig Dispense Refill  . amLODipine (NORVASC) 10 MG tablet TAKE 1 TABLET DAILY  30 tablet  6  . aspirin 81 MG tablet Take 81 mg by mouth daily.        Marland Kitchen atorvastatin (LIPITOR) 20 MG tablet Take 20 mg by mouth daily.      . furosemide (LASIX) 20 MG tablet Take 1 tablet (20 mg total) by mouth daily as needed for edema (leg swelling).  30 tablet  3  . meclizine (ANTIVERT) 25 MG tablet Take 25 mg by mouth 3 (three) times daily as needed for dizziness.      . metoprolol succinate (TOPROL XL) 25 MG 24 hr tablet Take 0.5 tablets (12.5 mg total) by mouth daily.  15 tablet  1  . nitroGLYCERIN (NITROSTAT) 0.4 MG SL tablet Place 1 tablet (0.4 mg total) under the tongue every 5 (five) minutes as needed.  25 tablet  3  . traZODone (DESYREL) 100 MG tablet Take 100 mg by mouth at bedtime.      . vitamin B-12 (CYANOCOBALAMIN) 1000 MCG tablet Take 1,000 mcg by mouth daily.       No current facility-administered medications for this visit.    History   Social History  . Marital Status: Widowed    Spouse Name: N/A    Number of Children: N/A  . Years of Education: N/A   Occupational History  . Not on file.   Social History Main Topics  . Smoking status: Former Smoker -- 0.80 packs/day for 10 years    Types: Cigarettes    Quit date: 05/26/1989  . Smokeless tobacco: Never Used  . Alcohol Use: No  . Drug Use: No  . Sexual Activity: Not on file   Other Topics Concern    . Not on file   Social History Narrative   Retired Printmaker    Family History  Problem Relation Age of Onset  . Heart failure Mother   . Coronary artery disease Mother   . Heart disease Mother     before age 19  . Hyperlipidemia Mother   . Hypertension Mother   . Stroke Father   . Hypertension Father   . Coronary artery disease Sister   . Hypertension Sister   . Hyperlipidemia Sister     Past Medical History  Diagnosis Date  . CAD (coronary artery disease)     DES x4 RCA, DES LAD,  /    catheterization January, 2008, nonobstructive disease  . Cough     December, 2011  . Hypertension   . Dyslipidemia   . Carotid bruit     Doppler, no significant stenosis  . AAA (abdominal aortic aneurysm)     Stent graft  . Colon polyps   . Depression     Situational depression with loss of family members  . 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 Hyperlipidemia   . Myocardial infarction 2009  . Leg pain     Past Surgical History  Procedure Laterality Date  . Argioplasty    . Abdominal aortic aneurysm repair  03/22/2007  . Cholecystectomy  2005    Patient Active Problem List   Diagnosis Date Noted  . Pain in limb 12/06/2013  . Edema of both legs 12/06/2013  . AAA (abdominal aortic aneurysm) without rupture 08/09/2013  . Carotid bruit   . Elevated bilirubin   . CAD (coronary artery disease)   . Hypertension   . Dyslipidemia   . AAA (abdominal aortic aneurysm)   . Depression     ROS   Patient denies fever, chills, headache, sweats, rash, change in vision, change in hearing, chest pain, cough, nausea vomiting, urinary symptoms. All other systems are reviewed and are negative.  PHYSICAL EXAM  Patient is stable. He's here with his wife. He is oriented to person time and place. Affect is normal. Head is atraumatic. Sclera and conjunctiva are normal. Lungs are clear. Respiratory effort is nonlabored. Cardiac exam reveals S1 and S2. The abdomen  is soft. He has no significant peripheral edema.  Filed Vitals:   02/20/14 0856  BP: 107/68  Pulse: 59  Height:  (1.854 m)  Weight: 217 lb (98.431 kg)  SpO2: 95%     ASSESSMENT & PLAN

## 2014-02-20 NOTE — Patient Instructions (Signed)

## 2014-02-20 NOTE — Assessment & Plan Note (Signed)
His edema is stable on low-dose diuretics. No change in therapy. There is a component of venous insufficiency.

## 2014-02-20 NOTE — Assessment & Plan Note (Signed)
Coronary disease is stable. Last nuclear scan was 2012. No testing is needed at this time. He has good LV function.

## 2014-03-31 IMAGING — US US EXTREM LOW VENOUS*L*
1 series · 14 of 24 positions shown · non-contrast
Comparison: None

CLINICAL DATA: Pain in the left lower extremity

LEFT LOWER EXTREMITY VENOUS DUPLEX ULTRASOUND
TECHNIQUE: Gray-scale sonography with graded compression, as well
as color Doppler and duplex ultrasound, were performed to evaluate
the deep venous system of the lower extremity from the level of the
common femoral vein through the popliteal and proximal calf veins.
Spectral Doppler was utilized to evaluate flow at rest and with
distal augmentation maneuvers.

[Series 1: us extrem low venous*left* · 14 of 46 slices shown]
[im 1/46]
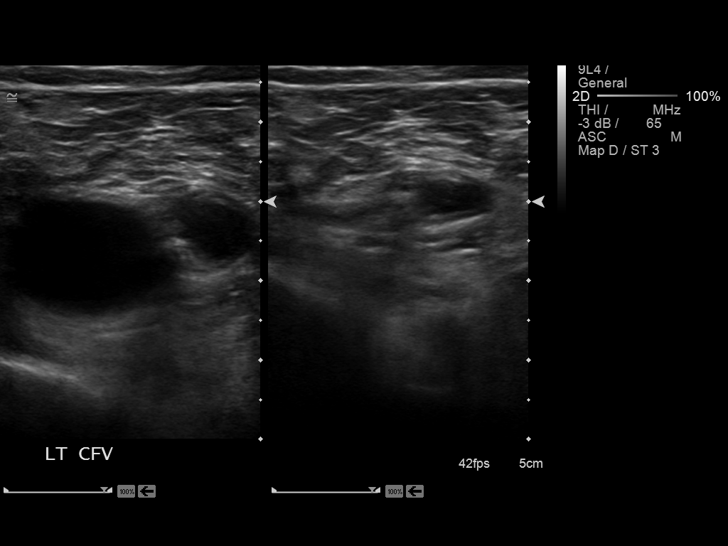
[im 4/46]
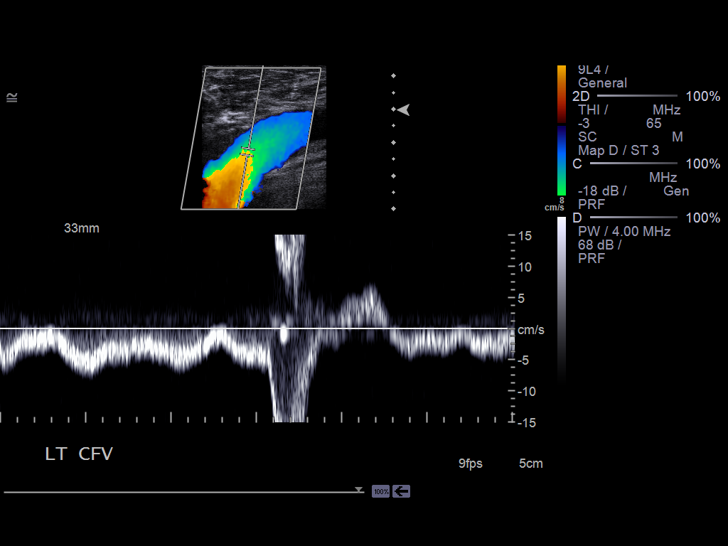
[im 8/46]
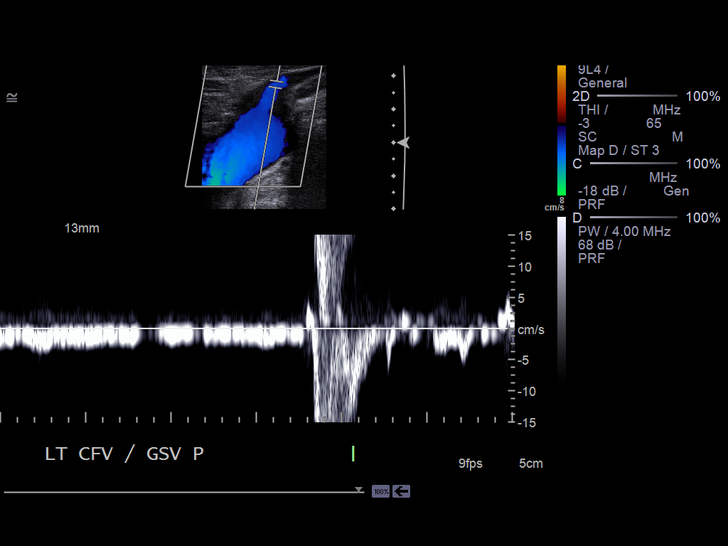
[im 12/46]
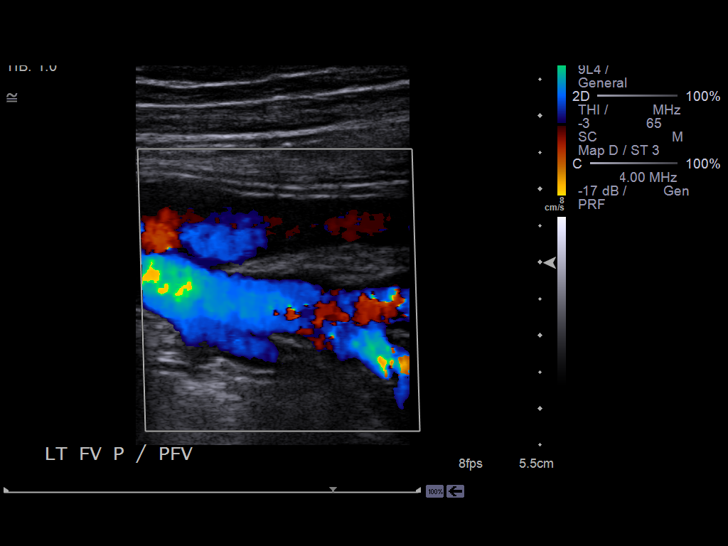
[im 14/46]
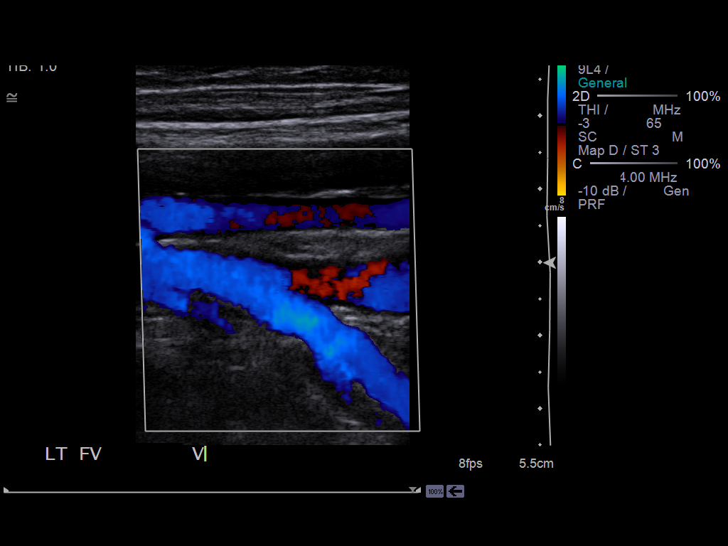
[im 18/46]
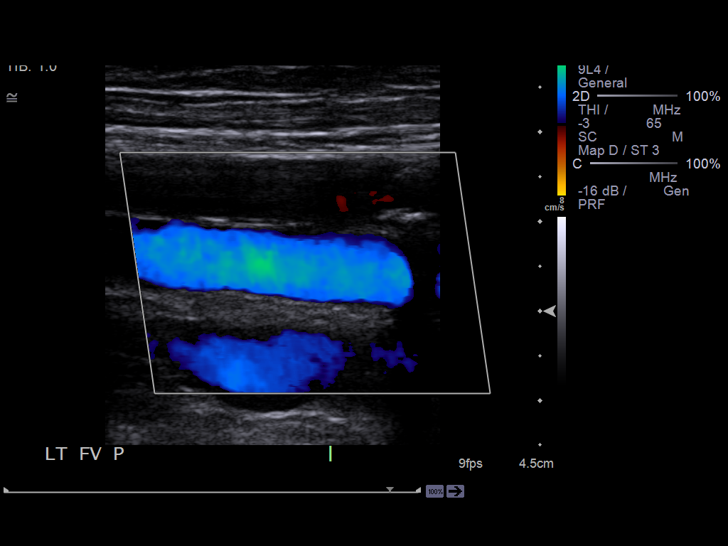
[im 22/46]
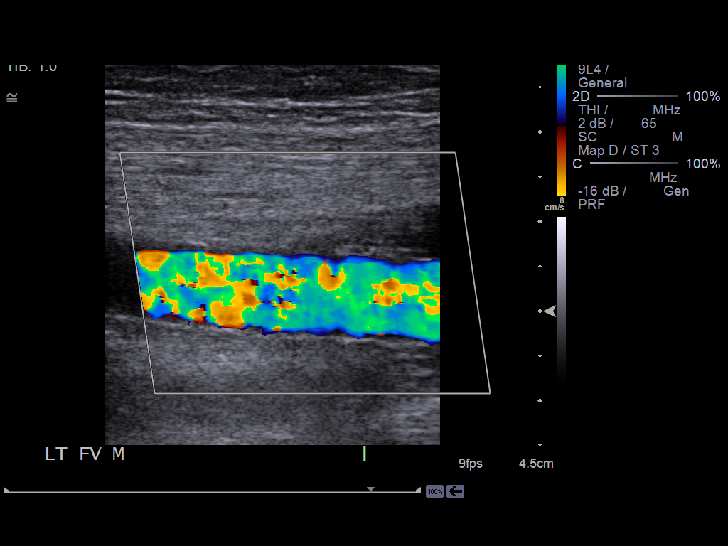
[im 24/46]
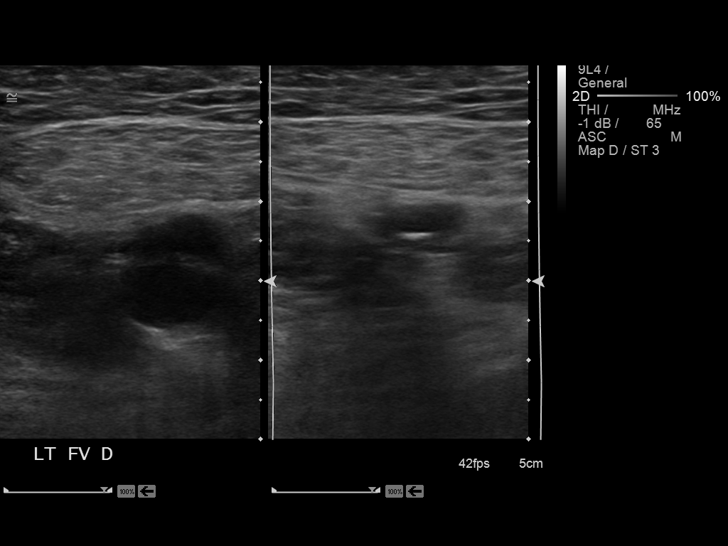
[im 28/46]
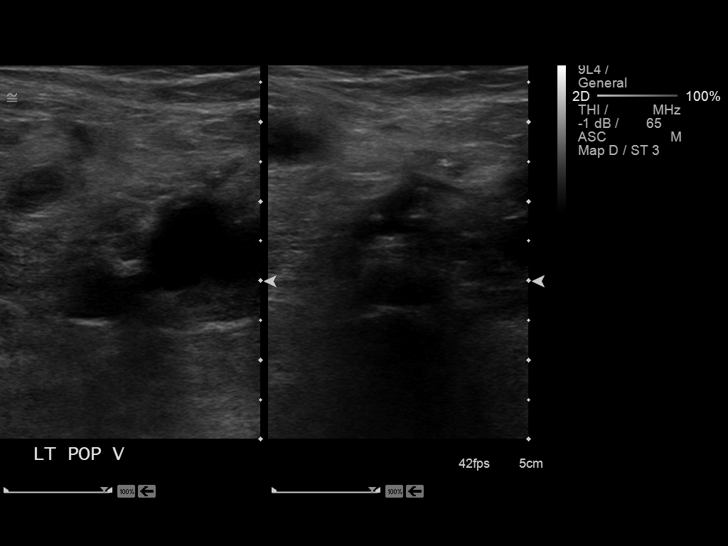
[im 32/46]
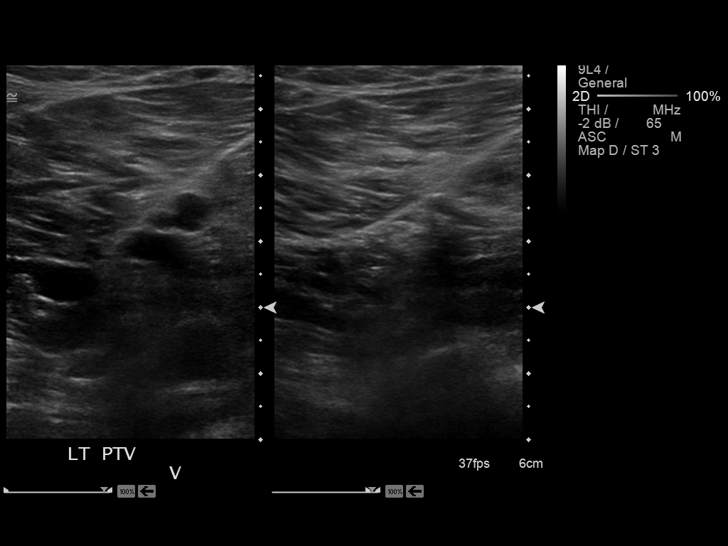
[im 36/46]
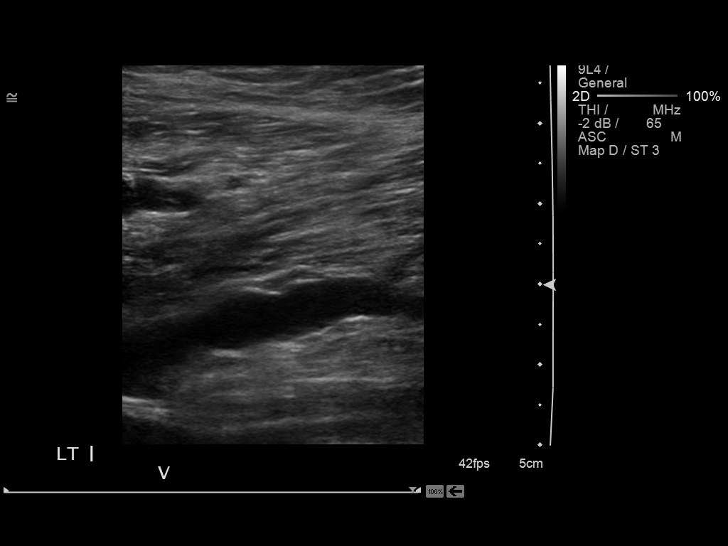
[im 38/46]
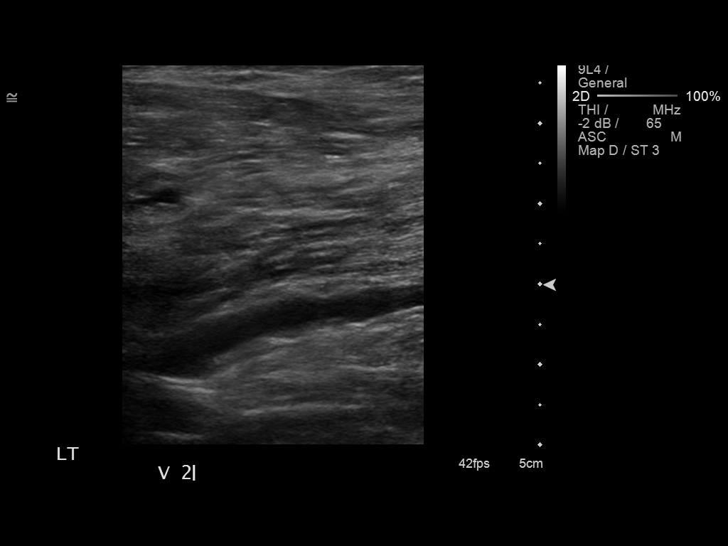
[im 42/46]
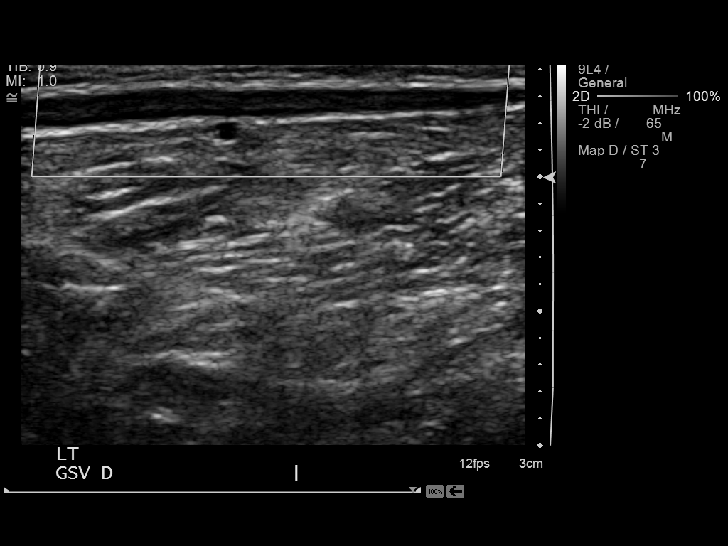
[im 46/46]
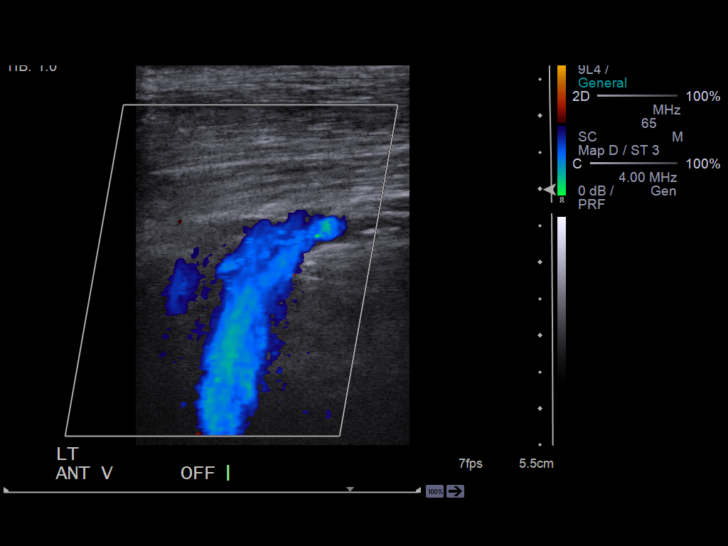

[14 of 24 positions shown; findings below may reference images not displayed]

FINDINGS: Deep venous system patent and compressible from left groin through
popliteal fossa.
Spontaneous venous flow present with intact augmentation and
evidence of respiratory phasicity.
No intraluminal thrombus identified.
Visualized portion of the greater saphenous system unremarkable.
IMPRESSION: No evidence of deep venous thrombosis in the left lower extremity.

## 2014-08-07 ENCOUNTER — Ambulatory Visit (INDEPENDENT_AMBULATORY_CARE_PROVIDER_SITE_OTHER): Payer: Medicare Other | Admitting: Cardiology

## 2014-08-07 ENCOUNTER — Encounter: Payer: Self-pay | Admitting: Cardiology

## 2014-08-07 VITALS — BP 130/88 | HR 59 | Ht 73.0 in | Wt 224.0 lb

## 2014-08-07 DIAGNOSIS — I2510 Atherosclerotic heart disease of native coronary artery without angina pectoris: Secondary | ICD-10-CM | POA: Diagnosis not present

## 2014-08-07 DIAGNOSIS — I714 Abdominal aortic aneurysm, without rupture, unspecified: Secondary | ICD-10-CM

## 2014-08-07 DIAGNOSIS — I10 Essential (primary) hypertension: Secondary | ICD-10-CM

## 2014-08-07 NOTE — Assessment & Plan Note (Signed)
Patient is followed by Dr. Hart RochesterLawson and has follow-up arranged.

## 2014-08-07 NOTE — Progress Notes (Signed)
Cardiology Office Note   Date:  08/07/2014   ID:  [name], DOB [birth year], MRN [id number]  PCP:  Olivia Canter, MD  Cardiologist:  Willa Rough, MD   Chief Complaint  Patient presents with  . Appointment    Follow-up coronary artery disease      History of Present Illness: [name] is a 81 y.o. male who presents today to follow up coronary disease and mild peripheral edema. He uses Lasix for his edema. It is related primarily to venous insufficiency. He's doing well. Labs were checked with his last visit and were stable.    Past Medical History  Diagnosis Date  . CAD (coronary artery disease)     DES x4 RCA, DES LAD,  /    catheterization January, 2008, nonobstructive disease  . Cough     December, 2011  . Hypertension   . Dyslipidemia   . Carotid bruit     Doppler, no significant stenosis  . AAA (abdominal aortic aneurysm)     Stent graft  . Colon polyps   . Depression     Situational depression with loss of family members  . 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 Hyperlipidemia   . Myocardial infarction 2009  . Leg pain     Past Surgical History  Procedure Laterality Date  . Argioplasty    . Abdominal aortic aneurysm repair  03/22/2007  . Cholecystectomy  2005    Patient Active Problem List   Diagnosis Date Noted  . Pain in limb 12/06/2013  . Edema of both legs 12/06/2013  . AAA (abdominal aortic aneurysm) without rupture 08/09/2013  . Carotid bruit   . Elevated bilirubin   . CAD (coronary artery disease)   . Hypertension   . Dyslipidemia   . AAA (abdominal aortic aneurysm)   . Depression       Current Outpatient Prescriptions  Medication Sig Dispense Refill  . amLODipine (NORVASC) 10 MG tablet TAKE 1 TABLET DAILY 30 tablet 6  . aspirin 81 MG tablet Take 81 mg by mouth daily.      Marland Kitchen atorvastatin (LIPITOR) 20 MG tablet Take 20 mg by mouth daily.    . furosemide (LASIX) 40 MG tablet Take 40 mg by mouth as  needed.    . metoprolol succinate (TOPROL XL) 25 MG 24 hr tablet Take 0.5 tablets (12.5 mg total) by mouth daily. 15 tablet 1  . nitroGLYCERIN (NITROSTAT) 0.4 MG SL tablet Place 1 tablet (0.4 mg total) under the tongue every 5 (five) minutes as needed. 25 tablet 3  . vitamin B-12 (CYANOCOBALAMIN) 1000 MCG tablet Take 1,000 mcg by mouth daily.     No current facility-administered medications for this visit.    Allergies:   Review of patient's allergies indicates no known allergies.    Social History:  The patient  reports that he quit smoking about 25 years ago. His smoking use included Cigarettes. He has a 8 pack-year smoking history. He has never used smokeless tobacco. He reports that he does not drink alcohol or use illicit drugs.   Family History:  The patient's family history includes Coronary artery disease in his mother and sister; Heart disease in his mother; Heart failure in his mother; Hyperlipidemia in his mother and sister; Hypertension in his father, mother, and sister; Stroke in his father.    ROS:  Please see the history of present illness.  Patient  denies fever, chills, headache, sweats, rash, change in vision, change in hearing, chest pain, cough, nausea or vomiting, urinary symptoms. All other systems are reviewed and are negative.    PHYSICAL EXAM: VS:  BP 130/88 mmHg  Pulse 59  Ht 6\' 1"  (1.854 m)  Wt 224 lb (101.606 kg)  BMI 29.56 kg/m2  SpO2 96% , Patient is oriented to person time and place. Affect is normal. He is here with his wife. Head is atraumatic. Sclera and conjunctiva are normal. There is no jugular venous distention. Lungs are clear. Respiratory effort is not labored. Cardiac exam reveals an S1 and S2. The rhythm is regular. Abdomen is soft. There is trace peripheral edema. There are no musculoskeletal deformities. There are no skin rashes.  EKG:   EKG is not done today.   Recent Labs: No results found for requested labs within last 365 days.     Lipid Panel    Component Value Date/Time   CHOL 120 07/03/2008 1141   TRIG 139 07/03/2008 1141   HDL 27.0* 07/03/2008 1141   CHOLHDL 4.4 CALC 07/03/2008 1141   VLDL 28 07/03/2008 1141   LDLCALC 65 07/03/2008 1141      Wt Readings from Last 3 Encounters:  08/07/14 224 lb (101.606 kg)  02/20/14 217 lb (98.431 kg)  12/13/13 223 lb (101.152 kg)      Current medicines are reviewed  The patient understands his medications.     ASSESSMENT AND PLAN:

## 2014-08-07 NOTE — Assessment & Plan Note (Signed)
Coronary disease is stable. He had a nuclear study in 2012 with slight. Infarct ischemia. He is not having any significant symptoms. He is on appropriate medications. No change in therapy.

## 2014-08-07 NOTE — Assessment & Plan Note (Signed)
Blood pressures treated none under good control. No change in therapy.  The patient is aware that I will be retiring at the end of September, 2016. He has met Dr. Harl Bowie in the past and would like to follow-up with Dr. Harl Bowie.

## 2014-08-07 NOTE — Patient Instructions (Addendum)
Your physician recommends that you schedule a follow-up appointment in: 6 months with Dr. Wyline MoodBranch. You will receive a reminder letter in the mail in 4 about months reminding you to call and schedule your appointment. If you don't receive this letter, please contact our office. Your physician recommends that you continue on your current medications as directed. Please refer to the Current Medication list given to you today.

## 2014-08-10 ENCOUNTER — Other Ambulatory Visit: Payer: Self-pay | Admitting: Vascular Surgery

## 2014-08-10 LAB — CREATININE, SERUM: Creat: 1.06 mg/dL (ref 0.50–1.35)

## 2014-08-10 LAB — BUN: BUN: 17 mg/dL (ref 6–23)

## 2014-08-14 ENCOUNTER — Encounter: Payer: Self-pay | Admitting: Vascular Surgery

## 2014-08-15 ENCOUNTER — Ambulatory Visit
Admission: RE | Admit: 2014-08-15 | Discharge: 2014-08-15 | Disposition: A | Discharge status: Home / Self Care | Payer: Medicare Other | Source: Ambulatory Visit | Attending: Vascular Surgery | Admitting: Vascular Surgery

## 2014-08-15 ENCOUNTER — Encounter: Payer: Self-pay | Admitting: Vascular Surgery

## 2014-08-15 ENCOUNTER — Ambulatory Visit (INDEPENDENT_AMBULATORY_CARE_PROVIDER_SITE_OTHER): Payer: Medicare Other | Admitting: Vascular Surgery

## 2014-08-15 VITALS — BP 133/81 | HR 65 | Resp 18 | Ht 73.0 in | Wt 222.0 lb

## 2014-08-15 DIAGNOSIS — I2510 Atherosclerotic heart disease of native coronary artery without angina pectoris: Secondary | ICD-10-CM | POA: Diagnosis not present

## 2014-08-15 DIAGNOSIS — I714 Abdominal aortic aneurysm, without rupture, unspecified: Secondary | ICD-10-CM

## 2014-08-15 DIAGNOSIS — Z48812 Encounter for surgical aftercare following surgery on the circulatory system: Secondary | ICD-10-CM | POA: Diagnosis not present

## 2014-08-15 MED ORDER — IOPAMIDOL (ISOVUE-370) INJECTION 76%
75.0000 mL | Freq: Once | INTRAVENOUS | Status: AC | PRN
  Administered 2014-08-15: 75 mL via INTRAVENOUS

## 2014-08-15 NOTE — Addendum Note (Signed)
Addended by: Sharee PimpleMCCHESNEY, MARILYN K on: 08/15/2014 12:36 PM   Modules accepted: Orders

## 2014-08-15 NOTE — Progress Notes (Signed)
Subjective:     Patient ID: [name], male   DOB: 03/14/1928, 81 y.o.   MRN: [id number]  HPI this 81 year old male returns for follow-up regarding his aortic stent graft inserted by Dr. Liliane BadeGreg Hayes in 2008 for abdominal aortic aneurysm. Patient has done well from that standpoint with no complications. He denies any abdominal or back symptoms. He also denies claudication. He has mild dyspnea on exertion. He was seen by me several months ago for bilateral lower extremity edema and had no reflux found in his superficial or deep systems and he is treated with diuretics with improvement area  Past Medical History  Diagnosis Date  . CAD (coronary artery disease)     DES x4 RCA, DES LAD,  /    catheterization January, 2008, nonobstructive disease  . Cough     December, 2011  . Hypertension   . Dyslipidemia   . Carotid bruit     Doppler, no significant stenosis  . AAA (abdominal aortic aneurysm)     Stent graft  . Colon polyps   . Depression     Situational depression with loss of family members  . 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 Hyperlipidemia   . Myocardial infarction 2009  . Leg pain     History  Substance Use Topics  . Smoking status: Former Smoker -- 0.80 packs/day for 10 years    Types: Cigarettes    Quit date: 05/26/1989  . Smokeless tobacco: Never Used  . Alcohol Use: No    Family History  Problem Relation Age of Onset  . Heart failure Mother   . Coronary artery disease Mother   . Heart disease Mother     before age 79  . Hyperlipidemia Mother   . Hypertension Mother   . Stroke Father   . Hypertension Father   . Coronary artery disease Sister   . Hypertension Sister   . Hyperlipidemia Sister     No Known Allergies   Current outpatient prescriptions:  .  amLODipine (NORVASC) 10 MG tablet, TAKE 1 TABLET DAILY, Disp: 30 tablet, Rfl: 6 .  aspirin 81 MG tablet, Take 81 mg by mouth daily.  , Disp: , Rfl:  .  atorvastatin (LIPITOR)  20 MG tablet, Take 20 mg by mouth daily., Disp: , Rfl:  .  furosemide (LASIX) 40 MG tablet, Take 40 mg by mouth as needed., Disp: , Rfl:  .  meclizine (ANTIVERT) 25 MG tablet, Take 25 mg by mouth as needed for dizziness., Disp: , Rfl:  .  metoprolol succinate (TOPROL XL) 25 MG 24 hr tablet, Take 0.5 tablets (12.5 mg total) by mouth daily., Disp: 15 tablet, Rfl: 1 .  nitroGLYCERIN (NITROSTAT) 0.4 MG SL tablet, Place 1 tablet (0.4 mg total) under the tongue every 5 (five) minutes as needed., Disp: 25 tablet, Rfl: 3 .  vitamin B-12 (CYANOCOBALAMIN) 1000 MCG tablet, Take 1,000 mcg by mouth daily., Disp: , Rfl:   Filed Vitals:   08/15/14 1047  BP: 133/81  Pulse: 65  Resp: 18  Height: 6\' 1"  (1.854 m)  Weight: 222 lb (100.699 kg)    Body mass index is 29.3 kg/(m^2).           Review of Systems denies chest pain but does have mild dyspnea on exertion. Occasional dizziness area bilateral lower extremity edema which is improved. Other systems negative and a complete review of systems  Objective:   Physical Exam BP 133/81 mmHg  Pulse 65  Resp 18  Ht  (1.854 m)  Wt 222 lb (100.699 kg)  BMI 29.30 kg/m2  Gen.-alert and oriented x3 in no apparent distress HEENT normal for age Lungs no rhonchi or wheezing Cardiovascular regular rhythm no murmurs carotid pulses 3+ palpable no bruits audible Abdomen soft nontender no palpable masses Musculoskeletal free of  major deformities Skin clear -no rashes Neurologic normal Lower extremities 3+ femoral and dorsalis pedis pulses palpable bilaterally with minimal edema  Today I ordered a CT angiogram of the abdomen and pelvis which I reviewed by computer. There is no evidence of endoleak in the aortic stent graft is in excellent position from the infrarenal aorta to bilateral common iliac arteries.       Assessment:     Doing well 8 years post insertion aortic stent graft for abdominal aortic aneurysm-inserted by Dr. Liliane Bade in  2008-no evidence of endoleak or other problems Bilateral lower extremity edema-improved with diuretics-followed by cardiology    Plan:     Return in one year for duplex scan of aortic aneurysm stent graft and be seen by nurse practitioner. Continue to follow on an annual basis with duplex scanning

## 2015-02-06 ENCOUNTER — Encounter: Payer: Self-pay | Admitting: *Deleted

## 2015-02-06 ENCOUNTER — Encounter: Payer: Self-pay | Admitting: Cardiology

## 2015-02-06 ENCOUNTER — Ambulatory Visit (INDEPENDENT_AMBULATORY_CARE_PROVIDER_SITE_OTHER): Payer: Medicare Other | Admitting: Cardiology

## 2015-02-06 VITALS — BP 115/71 | HR 56 | Ht 73.0 in | Wt 228.0 lb

## 2015-02-06 DIAGNOSIS — E785 Hyperlipidemia, unspecified: Secondary | ICD-10-CM | POA: Diagnosis not present

## 2015-02-06 DIAGNOSIS — I10 Essential (primary) hypertension: Secondary | ICD-10-CM | POA: Diagnosis not present

## 2015-02-06 DIAGNOSIS — R600 Localized edema: Secondary | ICD-10-CM

## 2015-02-06 DIAGNOSIS — I2510 Atherosclerotic heart disease of native coronary artery without angina pectoris: Secondary | ICD-10-CM | POA: Diagnosis not present

## 2015-02-06 MED ORDER — ATORVASTATIN CALCIUM 80 MG PO TABS: 80.0000 mg | ORAL_TABLET | Freq: Every day | ORAL | Status: AC

## 2015-02-06 NOTE — Patient Instructions (Signed)
Your physician has recommended you make the following change in your medication:   INCREASE LIPITOR 80 MG DAILY  WE WILL REQUEST LABS   Thank you for choosing [facility]!!

## 2015-02-06 NOTE — Progress Notes (Signed)
Patient ID: [name], male   DOB: Oct 06, 1927, 81 y.o.   MRN: [id number]     Clinical Summary [name] is a 81 y.o.male seen today as a focused visit, follow up for lower extremity edema. He is a regular patient of Dr Myrtis Ser   1. CAD  - hx of prior stenting as described below, last cath 79/2008 with patent stents and non-obstructive disease. - denies any recent chest pain. Denies any or SOB or DOE - compliant with meds  2 . LE edema  - echol LVEF at 60-65%, grade I diastolic dysfunction - [place] LE 11/2013 ordered by vascular showed no DVT, + evidence of reflux bilaterally.  - takes lasix as needed, takes approx 1-2 times a week which seems to control his swelling.   3. AAA - followed by vascular  4. OSA - compliant with CPAP  5. Hyperlipidemia - compliant with statin, no recent panel in our system   Past Medical History  Diagnosis Date  . CAD (coronary artery disease)     DES x4 RCA, DES LAD,  /    catheterization January, 2008, nonobstructive disease  . Cough     December, 2011  . Hypertension   . Dyslipidemia   . Carotid bruit     Doppler, no significant stenosis  . AAA (abdominal aortic aneurysm)     Stent graft  . Colon polyps   . Depression     Situational depression with loss of family members  . 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 Hyperlipidemia   . Myocardial infarction 2009  . Leg pain      No Known Allergies   Current Outpatient Prescriptions  Medication Sig Dispense Refill  . amLODipine (NORVASC) 10 MG tablet TAKE 1 TABLET DAILY 30 tablet 6  . aspirin 81 MG tablet Take 81 mg by mouth daily.      Marland Kitchen atorvastatin (LIPITOR) 20 MG tablet Take 20 mg by mouth daily.    . furosemide (LASIX) 40 MG tablet Take 40 mg by mouth as needed.    . meclizine (ANTIVERT) 25 MG tablet Take 25 mg by mouth as needed for dizziness.    . metoprolol succinate (TOPROL XL) 25 MG 24 hr tablet Take 0.5 tablets (12.5 mg total) by mouth daily.  15 tablet 1  . nitroGLYCERIN (NITROSTAT) 0.4 MG SL tablet Place 1 tablet (0.4 mg total) under the tongue every 5 (five) minutes as needed. 25 tablet 3  . vitamin B-12 (CYANOCOBALAMIN) 1000 MCG tablet Take 1,000 mcg by mouth daily.     No current facility-administered medications for this visit.     Past Surgical History  Procedure Laterality Date  . Argioplasty    . Abdominal aortic aneurysm repair  03/22/2007  . Cholecystectomy  2005     No Known Allergies    Family History  Problem Relation Age of Onset  . Heart failure Mother   . Coronary artery disease Mother   . Heart disease Mother     before age 23  . Hyperlipidemia Mother   . Hypertension Mother   . Stroke Father   . Hypertension Father   . Coronary artery disease Sister   . Hypertension Sister   . Hyperlipidemia Sister      Social History [name] reports that he quit smoking about 25 years ago. His smoking use included Cigarettes. He has a 8 pack-year smoking history. He has  never used smokeless tobacco. [name] reports that he does not drink alcohol.   Review of Systems CONSTITUTIONAL: No weight loss, fever, chills, weakness or fatigue.  HEENT: Eyes: No visual loss, blurred vision, double vision or yellow sclerae.No hearing loss, sneezing, congestion, runny nose or sore throat.  SKIN: No rash or itching.  CARDIOVASCULAR: per HPI RESPIRATORY: No shortness of breath, cough or sputum.  GASTROINTESTINAL: No anorexia, nausea, vomiting or diarrhea. No abdominal pain or blood.  GENITOURINARY: No burning on urination, no polyuria NEUROLOGICAL: No headache, dizziness, syncope, paralysis, ataxia, numbness or tingling in the extremities. No change in bowel or bladder control.  MUSCULOSKELETAL: No muscle, back pain, joint pain or stiffness.  LYMPHATICS: No enlarged nodes. No history of splenectomy.  PSYCHIATRIC: No history of depression or anxiety.  ENDOCRINOLOGIC: No reports of sweating, cold or heat  intolerance. No polyuria or polydipsia.  Marland Kitchen   Physical Examination Filed Vitals:   02/06/15 1023  BP: 115/71  Pulse: 56   Filed Vitals:   02/06/15 1023  Height: 6\' 1"  (1.854 m)  Weight: 228 lb (103.42 kg)    Gen: resting comfortably, no acute distress HEENT: no scleral icterus, pupils equal round and reactive, no palptable cervical adenopathy,  CV: RRR, no m/r/g,no JVD Resp: Clear to auscultation bilaterally GI: abdomen is soft, non-tender, non-distended, normal bowel sounds, no hepatosplenomegaly MSK: extremities are warm, no edema.  Skin: warm, no rash Neuro:  no focal deficits Psych: appropriate affect   Diagnostic Studies 12/07/13 Echo Study Conclusions  - Left ventricle: The cavity size was mildly dilated. Wall thickness was increased in a pattern of mild LVH. Systolic function was normal. The estimated ejection fraction was in the range of 60% to 65%. Wall motion was normal; there were no regional wall motion abnormalities. Doppler parameters are consistent with abnormal left ventricular relaxation (grade 1 diastolic dysfunction). Doppler parameters are consistent with borderline high filling pressure. - Aortic valve: Mildly calcified annulus. Trileaflet; mildly thickened leaflets. - Mitral valve: Mildly thickened leaflets .    Assessment and Plan  1. CAD - no current symptoms, continue risk factor modification and secondary prevention  2. LE edema - due to both grade I diastolic dysfunction and LE venous reflux - controlled with prn lasix, will continue  3. AAA - continue to follow with vascular  3. Hyperlipidemia - request most recent panel from pcp - given his history of CAD will change to high dose statin, change atorvastatin to 80mg  daily    Patient is moving to [place] and will be establishing with cardiology there with his wife's cardiologist through [facility]. No need for f/u.   Antoine Poche, M.D.

## 2015-08-17 ENCOUNTER — Encounter: Payer: Self-pay | Admitting: Family

## 2015-08-28 ENCOUNTER — Encounter: Payer: Self-pay | Admitting: Family

## 2015-08-28 ENCOUNTER — Ambulatory Visit (HOSPITAL_COMMUNITY)
Admission: RE | Admit: 2015-08-28 | Discharge: 2015-08-28 | Disposition: A | Discharge status: Home / Self Care | Payer: Medicare Other | Source: Ambulatory Visit | Attending: Family | Admitting: Family

## 2015-08-28 ENCOUNTER — Ambulatory Visit (INDEPENDENT_AMBULATORY_CARE_PROVIDER_SITE_OTHER): Payer: Medicare Other | Admitting: Family

## 2015-08-28 VITALS — BP 135/83 | HR 75 | Resp 14 | Wt 224.0 lb

## 2015-08-28 DIAGNOSIS — I10 Essential (primary) hypertension: Secondary | ICD-10-CM | POA: Diagnosis not present

## 2015-08-28 DIAGNOSIS — Z95828 Presence of other vascular implants and grafts: Secondary | ICD-10-CM

## 2015-08-28 DIAGNOSIS — I714 Abdominal aortic aneurysm, without rupture, unspecified: Secondary | ICD-10-CM

## 2015-08-28 DIAGNOSIS — Z48812 Encounter for surgical aftercare following surgery on the circulatory system: Secondary | ICD-10-CM

## 2015-08-28 DIAGNOSIS — F329 Major depressive disorder, single episode, unspecified: Secondary | ICD-10-CM | POA: Insufficient documentation

## 2015-08-28 DIAGNOSIS — I2510 Atherosclerotic heart disease of native coronary artery without angina pectoris: Secondary | ICD-10-CM | POA: Diagnosis not present

## 2015-08-28 DIAGNOSIS — I252 Old myocardial infarction: Secondary | ICD-10-CM | POA: Insufficient documentation

## 2015-08-28 DIAGNOSIS — E785 Hyperlipidemia, unspecified: Secondary | ICD-10-CM | POA: Insufficient documentation

## 2015-08-28 NOTE — Progress Notes (Signed)
[facility]  [facility]abl[facility]  History of Present Illness  [name] is a 82 y.o. (09/21/1927) male patient of Dr. Hart RochesterLawson returns for follow-up regarding his aortic stent graft inserted by Dr. Liliane BadeGreg Hayes in 2008 for abdominal aortic aneurysm. Patient has done well from that standpoint with no complications. He  denies claudication. He has mild dyspnea on exertion. He was seen by Dr. Hart RochesterLawson in July of 2015 for bilateral lower extremity edema and had no reflux found in his superficial or deep systems and he is treated with diuretics with improvement. Pt states his chiropractor thinks he has lumbar spine issues that causes right leg radiculopathy, he denies back pain. Pt denies abdominal pain.  Pt states he has several cardiac stents, states he has a hx of a mild MI. Dr. Charyl DancerShannon St. Alan RipperClaire is his cardiologist, states he wants his records sent there also.  Pt denies any hx of stroke of TIA.  He was tried on a statin but had severe legs myalgias with this. He takes a daily 81 mg ASA. Pt Diabetic: No Pt smoker: former smoker, quit in 1991, smoked for 10-15 years, states he was a light smoker   Past Medical History  Diagnosis Date  . CAD (coronary artery disease)     DES x4 RCA, DES LAD,  /    catheterization January, 2008, nonobstructive disease  . Cough     December, 2011  . Hypertension   . Dyslipidemia   . Carotid bruit     Doppler, no significant stenosis  . AAA (abdominal aortic aneurysm)     Stent graft  . Colon polyps   . Depression     Situational depression with loss of family members  . 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 Hyperlipidemia   . Myocardial infarction (HCC) 2009  . Leg pain    Past Surgical History  Procedure Laterality Date  . Argioplasty    . Abdominal aortic aneurysm repair  03/22/2007  . Cholecystectomy  2005   Social History Social History  Substance Use Topics  . Smoking status: Former  Smoker -- 0.80 packs/day for 10 years    Types: Cigarettes    Start date: 03/27/1941    Quit date: 05/26/1989  . Smokeless tobacco: Never Used  . Alcohol Use: No   Family History Family History  Problem Relation Age of Onset  . Heart failure Mother   . Coronary artery disease Mother   . Heart disease Mother     before age 80  . Hyperlipidemia Mother   . Hypertension Mother   . Stroke Father   . Hypertension Father   . Coronary artery disease Sister   . Hypertension Sister   . Hyperlipidemia Sister    Current Outpatient Prescriptions on File Prior to Visit  Medication Sig Dispense Refill  . amLODipine (NORVASC) 10 MG tablet TAKE 1 TABLET DAILY 30 tablet 6  . aspirin 81 MG tablet Take 81 mg by mouth daily.      Marland Kitchen. atorvastatin (LIPITOR) 80 MG tablet Take 1 tablet (80 mg total) by mouth daily. 90 tablet 3  . furosemide (LASIX) 40 MG tablet Take 40 mg by mouth as needed.    . meclizine (ANTIVERT) 25 MG tablet Take 25 mg by mouth 3 (three) times daily as needed for dizziness.     . metoprolol succinate (TOPROL XL) 25 MG 24 hr tablet  Take 0.5 tablets (12.5 mg total) by mouth daily. 15 tablet 1  . nitroGLYCERIN (NITROSTAT) 0.4 MG SL tablet Place 1 tablet (0.4 mg total) under the tongue every 5 (five) minutes as needed. 25 tablet 3  . vitamin B-12 (CYANOCOBALAMIN) 1000 MCG tablet Take 1,000 mcg by mouth daily.     No current facility-administered medications on file prior to visit.   No Known Allergies   ROS: See HPI for pertinent positives and negatives.  Physical Examination  Filed Vitals:   08/28/15 0919  BP: 135/83  Pulse: 75  Resp: 14  Weight: 224 lb (101.606 kg)  SpO2: 95%   Body mass index is 29.56 kg/(m^2).  General: A&O x 3, WD  Pulmonary: Sym exp, good air movt, CTAB, no rales, rhonchi, or wheezing.   Cardiac: RRR, Nl S1, S2, no murmur appreciated  Vascular: Vessel Right Left  Radial 2+Palpable 2+Palpable  Carotid  without bruit  without bruit  Aorta Not  palpable N/A  Femoral 2+Palpable 2+Palpable  Popliteal Not palpable Not palpable  PT 2+Palpable 2+Palpable  DP Not Palpable Not Palpable   Gastrointestinal: soft, NTND, -G/R, - HSM, reducible ventral hernia, - CVAT B.  Musculoskeletal: M/S 5/5 throughout, extremities without ischemic changes.  Neurologic: Pain and light touch intact in extremities, Motor exam as listed above. CN 2-12 intact except right pupil is fixed and dilated since a childhood injury.  Non-Invasive Vascular Imaging  EVAR Duplex (Date: 08/28/2015) ABDOMINAL AORTA DUPLEX EVALUATION - POST ENDOVASCULAR REPAIR    INDICATION: Evaluation of endovascular abdominal repair of aortic aneurysm.    PREVIOUS INTERVENTION(S): Stent repair of abdominal aortic aneurysm 2008    DUPLEX EXAM:      DIAMETER AP (cm) DIAMETER TRANSVERSE (cm) VELOCITIES (cm/sec)  Aorta 4.12 4.11 65  Right Common Iliac 1.31 1.37   Left Common Iliac Not visualized  Not visualized     Comparison Study       Date DIAMETER AP (cm) DIAMETER TRANSVERSE (cm)  08/09/2013 4.4 4.8     ADDITIONAL FINDINGS:     IMPRESSION: Patent endovascular abdominal aortic repair with a maximum diameter of 4.12 x 4.11 cm. Limited visualization of the abdominal vasculature due to overlying bowel gas.    Compared to the previous exam:  No significant change in comparison to the last exam on 08/09/2013.      Medical Decision Making  [name] is a 82 y.o. male who presents s/p EVAR (Date: in 2008).  Pt is asymptomatic with a decrease in sac size; 4.12 cm today, 4.8 cm on 08/09/13. Limited visualization of the abdominal vasculature due to overlying bowel gas.  I discussed with the patient the importance of surveillance of the endograft.  The next endograft duplex will be scheduled for 12 months.   The patient will follow up with [place] in 12 months with these studies.  I emphasized the importance of maximal medical management including strict control of blood  pressure, blood glucose, and lipid levels, antiplatelet agents, obtaining regular exercise, and cessation of smoking.   Thank you for allowing [place] to participate in this patient's care.  Charisse March, RN, MSN, FNP-C [facility] Office: [phone]  Clinic Physician: Hart Rochester  08/28/2015, 9:09 AM

## 2015-09-10 NOTE — Addendum Note (Signed)
Addended by: Melodye PedMANESS-HARRISON, Jayona Mccaig C on: 09/10/2015 04:02 PM   Modules accepted: Orders

## 2015-09-28 ENCOUNTER — Encounter: Payer: Self-pay | Admitting: Gastroenterology

## 2016-08-20 ENCOUNTER — Encounter: Payer: Self-pay | Admitting: Family

## 2016-09-02 ENCOUNTER — Ambulatory Visit: Payer: Medicare Other | Admitting: Family

## 2016-09-02 ENCOUNTER — Other Ambulatory Visit (HOSPITAL_COMMUNITY): Payer: Medicare Other

## 2023-12-06 ENCOUNTER — Encounter (HOSPITAL_COMMUNITY): Payer: Self-pay | Admitting: Emergency Medicine

## 2023-12-06 ENCOUNTER — Other Ambulatory Visit: Payer: Self-pay

## 2023-12-06 ENCOUNTER — Emergency Department (HOSPITAL_COMMUNITY)
Admission: EM | Admit: 2023-12-06 | Discharge: 2023-12-07 | Disposition: A | Discharge status: Home / Self Care | Attending: Emergency Medicine | Admitting: Emergency Medicine

## 2023-12-06 DIAGNOSIS — I2510 Atherosclerotic heart disease of native coronary artery without angina pectoris: Secondary | ICD-10-CM | POA: Insufficient documentation

## 2023-12-06 DIAGNOSIS — Z79899 Other long term (current) drug therapy: Secondary | ICD-10-CM | POA: Insufficient documentation

## 2023-12-06 DIAGNOSIS — I252 Old myocardial infarction: Secondary | ICD-10-CM | POA: Insufficient documentation

## 2023-12-06 DIAGNOSIS — Z955 Presence of coronary angioplasty implant and graft: Secondary | ICD-10-CM | POA: Insufficient documentation

## 2023-12-06 DIAGNOSIS — E785 Hyperlipidemia, unspecified: Secondary | ICD-10-CM | POA: Insufficient documentation

## 2023-12-06 DIAGNOSIS — Z87891 Personal history of nicotine dependence: Secondary | ICD-10-CM | POA: Diagnosis not present

## 2023-12-06 DIAGNOSIS — R1013 Epigastric pain: Secondary | ICD-10-CM

## 2023-12-06 DIAGNOSIS — B3781 Candidal esophagitis: Secondary | ICD-10-CM | POA: Insufficient documentation

## 2023-12-06 DIAGNOSIS — I10 Essential (primary) hypertension: Secondary | ICD-10-CM | POA: Insufficient documentation

## 2023-12-06 DIAGNOSIS — Z7982 Long term (current) use of aspirin: Secondary | ICD-10-CM | POA: Insufficient documentation

## 2023-12-06 DIAGNOSIS — Z8249 Family history of ischemic heart disease and other diseases of the circulatory system: Secondary | ICD-10-CM | POA: Diagnosis not present

## 2023-12-06 DIAGNOSIS — R1319 Other dysphagia: Secondary | ICD-10-CM

## 2023-12-06 DIAGNOSIS — Z83438 Family history of other disorder of lipoprotein metabolism and other lipidemia: Secondary | ICD-10-CM | POA: Insufficient documentation

## 2023-12-06 DIAGNOSIS — R0989 Other specified symptoms and signs involving the circulatory and respiratory systems: Secondary | ICD-10-CM | POA: Diagnosis not present

## 2023-12-06 DIAGNOSIS — T18128A Food in esophagus causing other injury, initial encounter: Secondary | ICD-10-CM | POA: Insufficient documentation

## 2023-12-06 DIAGNOSIS — G4730 Sleep apnea, unspecified: Secondary | ICD-10-CM | POA: Insufficient documentation

## 2023-12-06 DIAGNOSIS — W44F3XA Food entering into or through a natural orifice, initial encounter: Secondary | ICD-10-CM | POA: Diagnosis not present

## 2023-12-06 NOTE — ED Triage Notes (Signed)
 Pt states he got choked eating lunch Saturday and since has not been able to eat and has been drooling. Pt denies any pain.

## 2023-12-07 ENCOUNTER — Emergency Department (HOSPITAL_COMMUNITY): Admitting: Anesthesiology

## 2023-12-07 ENCOUNTER — Encounter (HOSPITAL_COMMUNITY)
Admission: EM | Disposition: A | Discharge status: Home / Self Care | Payer: Self-pay | Source: Home / Self Care | Attending: Emergency Medicine

## 2023-12-07 DIAGNOSIS — Z87891 Personal history of nicotine dependence: Secondary | ICD-10-CM | POA: Diagnosis not present

## 2023-12-07 DIAGNOSIS — W44F3XA Food entering into or through a natural orifice, initial encounter: Secondary | ICD-10-CM | POA: Diagnosis not present

## 2023-12-07 DIAGNOSIS — T18128A Food in esophagus causing other injury, initial encounter: Secondary | ICD-10-CM | POA: Diagnosis not present

## 2023-12-07 DIAGNOSIS — I2510 Atherosclerotic heart disease of native coronary artery without angina pectoris: Secondary | ICD-10-CM | POA: Diagnosis not present

## 2023-12-07 DIAGNOSIS — R1013 Epigastric pain: Secondary | ICD-10-CM | POA: Diagnosis not present

## 2023-12-07 DIAGNOSIS — K229 Disease of esophagus, unspecified: Secondary | ICD-10-CM | POA: Diagnosis not present

## 2023-12-07 DIAGNOSIS — B3781 Candidal esophagitis: Secondary | ICD-10-CM | POA: Diagnosis not present

## 2023-12-07 DIAGNOSIS — Z7982 Long term (current) use of aspirin: Secondary | ICD-10-CM | POA: Diagnosis not present

## 2023-12-07 DIAGNOSIS — Z79899 Other long term (current) drug therapy: Secondary | ICD-10-CM | POA: Diagnosis not present

## 2023-12-07 DIAGNOSIS — I10 Essential (primary) hypertension: Secondary | ICD-10-CM | POA: Diagnosis not present

## 2023-12-07 DIAGNOSIS — R1319 Other dysphagia: Secondary | ICD-10-CM

## 2023-12-07 HISTORY — PX: ESOPHAGOGASTRODUODENOSCOPY: SHX5428

## 2023-12-07 LAB — BASIC METABOLIC PANEL WITH GFR
Anion gap: 11 (ref 5–15)
BUN: 21 mg/dL (ref 8–23)
CO2: 21 mmol/L — ABNORMAL LOW (ref 22–32)
Calcium: 9 mg/dL (ref 8.9–10.3)
Chloride: 101 mmol/L (ref 98–111)
Creatinine, Ser: 1.03 mg/dL (ref 0.61–1.24)
GFR, Estimated: >60 mL/min (ref >60)
Glucose, Bld: 149 mg/dL — ABNORMAL HIGH (ref 70–99)
Potassium: 3.9 mmol/L (ref 3.5–5.1)
Sodium: 133 mmol/L — ABNORMAL LOW (ref 135–145)

## 2023-12-07 LAB — CBC WITH DIFFERENTIAL/PLATELET
Abs Immature Granulocytes: 0.03 K/uL (ref 0.00–0.07)
Basophils Absolute: 0.1 K/uL (ref 0.0–0.1)
Basophils Relative: 1 %
Eosinophils Absolute: 0.2 K/uL (ref 0.0–0.5)
Eosinophils Relative: 2 %
HCT: 49.2 % (ref 39.0–52.0)
Hemoglobin: 16.9 g/dL (ref 13.0–17.0)
Immature Granulocytes: 0 %
Lymphocytes Relative: 13 %
Lymphs Abs: 1.3 K/uL (ref 0.7–4.0)
MCH: 31.4 pg (ref 26.0–34.0)
MCHC: 34.3 g/dL (ref 30.0–36.0)
MCV: 91.3 fL (ref 80.0–100.0)
Monocytes Absolute: 0.7 K/uL (ref 0.1–1.0)
Monocytes Relative: 7 %
Neutro Abs: 7.9 K/uL — ABNORMAL HIGH (ref 1.7–7.7)
Neutrophils Relative %: 77 %
Platelets: 129 K/uL — ABNORMAL LOW (ref 150–400)
RBC: 5.39 MIL/uL (ref 4.22–5.81)
RDW: 14.8 % (ref 11.5–15.5)
WBC: 10.1 K/uL (ref 4.0–10.5)
nRBC: 0 % (ref 0.0–0.2)

## 2023-12-07 SURGERY — EGD (ESOPHAGOGASTRODUODENOSCOPY)
Anesthesia: General

## 2023-12-07 MED ORDER — NYSTATIN 100000 UNIT/ML MT SUSP
5.0000 mL | Freq: Four times a day (QID) | OROMUCOSAL | 0 refills | Status: AC
Start: 2023-12-07 — End: 2023-12-21

## 2023-12-07 MED ORDER — ONDANSETRON HCL 4 MG/2ML IJ SOLN
INTRAMUSCULAR | Status: AC
  Filled 2023-12-07: qty 2

## 2023-12-07 MED ORDER — LIDOCAINE 2% (20 MG/ML) 5 ML SYRINGE
INTRAMUSCULAR | Status: AC
  Filled 2023-12-07: qty 5

## 2023-12-07 MED ORDER — SODIUM CHLORIDE 0.9 % IV SOLN: INTRAVENOUS | Status: DC

## 2023-12-07 MED ORDER — PANTOPRAZOLE SODIUM 40 MG PO TBEC: 40.0000 mg | DELAYED_RELEASE_TABLET | Freq: Every day | ORAL | 11 refills | Status: AC

## 2023-12-07 MED ORDER — LACTATED RINGERS IV SOLN: INTRAVENOUS | Status: DC

## 2023-12-07 MED ORDER — DIAZEPAM 5 MG/ML IJ SOLN
2.5000 mg | Freq: Once | INTRAMUSCULAR | Status: AC
  Administered 2023-12-07: 2.5 mg via INTRAVENOUS
  Filled 2023-12-07: qty 2

## 2023-12-07 MED ORDER — SODIUM CHLORIDE 0.9 % IV BOLUS
500.0000 mL | Freq: Once | INTRAVENOUS | Status: AC
  Administered 2023-12-07: 500 mL via INTRAVENOUS

## 2023-12-07 MED ORDER — SUCCINYLCHOLINE CHLORIDE 200 MG/10ML IV SOSY
PREFILLED_SYRINGE | INTRAVENOUS | Status: AC
  Filled 2023-12-07: qty 10

## 2023-12-07 MED ORDER — ONDANSETRON HCL 4 MG/2ML IJ SOLN
INTRAMUSCULAR | Status: DC | PRN
  Administered 2023-12-07: 4 mg via INTRAVENOUS

## 2023-12-07 MED ORDER — SUCCINYLCHOLINE CHLORIDE 200 MG/10ML IV SOSY
PREFILLED_SYRINGE | INTRAVENOUS | Status: DC | PRN
  Administered 2023-12-07: 120 mg via INTRAVENOUS

## 2023-12-07 MED ORDER — PROPOFOL 500 MG/50ML IV EMUL
INTRAVENOUS | Status: DC | PRN
Start: 2023-12-07 — End: 2023-12-07
  Administered 2023-12-07: 130 mg via INTRAVENOUS

## 2023-12-07 MED ORDER — GLUCAGON HCL RDNA (DIAGNOSTIC) 1 MG IJ SOLR
1.0000 mg | Freq: Once | INTRAMUSCULAR | Status: AC
  Administered 2023-12-07: 1 mg via INTRAVENOUS
  Filled 2023-12-07: qty 1

## 2023-12-07 MED ORDER — PHENYLEPHRINE 80 MCG/ML (10ML) SYRINGE FOR IV PUSH (FOR BLOOD PRESSURE SUPPORT)
PREFILLED_SYRINGE | INTRAVENOUS | Status: AC
  Filled 2023-12-07: qty 10

## 2023-12-07 NOTE — Anesthesia Procedure Notes (Signed)
 Procedure Name: Intubation Date/Time: 12/07/2023 9:23 AM  Performed by: Augusta Daved SAILOR, CRNAPre-anesthesia Checklist: Patient identified, Emergency Drugs available, Suction available and Patient being monitored Patient Re-evaluated:Patient Re-evaluated prior to induction Oxygen Delivery Method: Circle System Utilized Preoxygenation: Pre-oxygenation with 100% oxygen Induction Type: IV induction, Cricoid Pressure applied and Rapid sequence Laryngoscope Size: Glidescope and 3 Grade View: Grade I Tube type: Oral Tube size: 7.5 mm Number of attempts: 1 Airway Equipment and Method: Stylet and Oral airway Placement Confirmation: ETT inserted through vocal cords under direct vision, positive ETCO2 and breath sounds checked- equal and bilateral Secured at: 23 cm Tube secured with: Tape Dental Injury: Teeth and Oropharynx as per pre-operative assessment

## 2023-12-07 NOTE — Anesthesia Postprocedure Evaluation (Signed)
 Anesthesia Post Note  Patient: [name]  Procedure(s) Performed: EGD (ESOPHAGOGASTRODUODENOSCOPY)  Patient location during evaluation: PACU Anesthesia Type: General Level of consciousness: awake and alert Pain management: pain level controlled Vital Signs Assessment: post-procedure vital signs reviewed and stable Respiratory status: spontaneous breathing, nonlabored ventilation and respiratory function stable Cardiovascular status: blood pressure returned to baseline and stable Postop Assessment: no apparent nausea or vomiting Anesthetic complications: no   There were no known notable events for this encounter.   Last Vitals:  Vitals:   12/07/23 1000 12/07/23 1030  BP: 127/69 128/68  Pulse: 66 62  Resp: (!) 23 (!) 22  Temp:  36.6 C  SpO2: 95% 96%    Last Pain:  Vitals:   12/07/23 1000  TempSrc:   PainSc: 0-No pain                 Hermon Zea L Kimarie Coor

## 2023-12-07 NOTE — ED Notes (Signed)
 Pt attempted to drink small sips of coke per MD request. Pt unable to keep PO fluids down. MD notified.

## 2023-12-07 NOTE — Op Note (Signed)
 [facility] Patient Name: [name] Procedure Date: 12/07/2023 9:09 AM MRN: [id number] Date of Birth: November 29, 1927 Attending MD: Carlin POUR. Cindie , [place], [id number] CSN: [id number] Age: 89 Admit Type: Outpatient Procedure:                Upper GI endoscopy Indications:              Foreign body in the esophagus Providers:                Carlin POUR. Cindie, DO, Rosina Sprague, [place] Wilson,                            Technician Referring MD:              Medicines:                See the Anesthesia note for documentation of the                            administered medications Complications:            No immediate complications. Estimated Blood Loss:     Estimated blood loss was minimal. Procedure:                Pre-Anesthesia Assessment:                           - The anesthesia plan was to use general anesthesia.                           After obtaining informed consent, the endoscope was                            passed under direct vision. Throughout the                            procedure, the patient's blood pressure, pulse, and                            oxygen saturations were monitored continuously. The                            [id number]-[id number] ([id number]) scope was introduced through the                            mouth, and advanced to the second part of duodenum.                            The upper GI endoscopy was accomplished without                            difficulty. The patient tolerated the procedure                            well. Scope In: 9:28:00 AM Scope Out: 9:46:35 AM Total Procedure Duration: 0 hours 18 minutes 35 seconds  Findings:      Food was found in the middle third  of the esophagus. This was gently       pushed into the stomach.      Diffuse, white plaques were found in the middle third of the esophagus.      The entire examined stomach was normal.      The duodenal bulb, first portion of the duodenum and second portion of       the duodenum  were normal. Impression:               - Food in the middle third of the esophagus.                           - Esophageal plaques were found, consistent with                            candidiasis.                           - Normal stomach.                           - Normal duodenal bulb, first portion of the                            duodenum and second portion of the duodenum.                           - No specimens collected. Moderate Sedation:      Per Anesthesia Care Recommendation:           - Patient has a contact number available for                            emergencies. The signs and symptoms of potential                            delayed complications were discussed with the                            patient. Return to normal activities tomorrow.                            Written discharge instructions were provided to the                            patient.                           - Mechanical soft diet.                           - Use a proton pump inhibitor PO BID.                           - Will perform post op EKG as I do not see updated  one in his chart for past few years. If no evidence                            of qtc prolongation will start on Diflucan x14 days.                           - Return to GI clinic in 4 weeks. Consider repeat                            EGD evaluation in 6-8 weeks with possible dilation. Procedure Code(s):        --- Professional ---                           [id number], Esophagogastroduodenoscopy, flexible,                            transoral; diagnostic, including collection of                            specimen(s) by brushing or washing, when performed                            (separate procedure) Diagnosis Code(s):        --- Professional ---                           U81.871J, Food in esophagus causing other injury,                            initial encounter                           K22.9,  Disease of esophagus, unspecified                           T18.108A, Unspecified foreign body in esophagus                            causing other injury, initial encounter CPT copyright 2022 [employer]. All rights reserved. The codes documented in this report are preliminary and upon coder review may  be revised to meet current compliance requirements. Carlin POUR. Cindie, DO Carlin POUR. Cindie, DO 12/07/2023 9:55:08 AM This report has been signed electronically. Number of Addenda: 0

## 2023-12-07 NOTE — Anesthesia Preprocedure Evaluation (Addendum)
 Anesthesia Evaluation  Patient identified by MRN, date of birth, ID band Patient awake    Reviewed: Allergy & Precautions, H&P , NPO status , Patient's Chart, lab work & pertinent test results, reviewed documented beta blocker date and time   Airway Mallampati: II  TM Distance: >3 FB Neck ROM: full    Dental  (+) Edentulous Upper, Edentulous Lower   Pulmonary sleep apnea and Continuous Positive Airway Pressure Ventilation , former smoker   Pulmonary exam normal breath sounds clear to auscultation       Cardiovascular Exercise Tolerance: Good hypertension, + CAD and + Past MI  Normal cardiovascular exam Rhythm:regular Rate:Normal  AAA with stent   Neuro/Psych  PSYCHIATRIC DISORDERS  Depression    negative neurological ROS     GI/Hepatic negative GI ROS, Neg liver ROS,,,  Endo/Other  negative endocrine ROS    Renal/GU negative Renal ROS  negative genitourinary   Musculoskeletal   Abdominal   Peds  Hematology negative hematology ROS (+)   Anesthesia Other Findings   Reproductive/Obstetrics negative OB ROS                              Anesthesia Physical Anesthesia Plan  ASA: 3  Anesthesia Plan: General   Post-op Pain Management: Minimal or no pain anticipated   Induction: Intravenous  PONV Risk Score and Plan:   Airway Management Planned: Oral ETT  Additional Equipment: None  Intra-op Plan:   Post-operative Plan: Extubation in OR  Informed Consent: I have reviewed the patients History and Physical, chart, labs and discussed the procedure including the risks, benefits and alternatives for the proposed anesthesia with the patient or authorized representative who has indicated his/her understanding and acceptance.     Dental Advisory Given  Plan Discussed with: CRNA and Surgeon  Anesthesia Plan Comments:          Anesthesia Quick Evaluation

## 2023-12-07 NOTE — ED Notes (Signed)
 Pt undressed and placed in a gown for his endoscopy.

## 2023-12-07 NOTE — ED Provider Notes (Signed)
 [place] EMERGENCY DEPARTMENT AT [facility] Provider Note   CSN: [id number] Arrival date & time: 12/06/23  2340     Patient presents with: Choking   [name] is a 89 y.o. male.   Patient is a 89 year old male presenting with complaints of possible esophageal food impaction.  He reports eating a piece of steak yesterday afternoon and felt like it got stuck.  Since then, he reports an inability to eat or drink and his vomiting back up everything he takes in.  No chest pain of difficulty breathing.  No abdominal pain.       Prior to Admission medications   Medication Sig Start Date End Date Taking? Authorizing Provider  amLODipine  (NORVASC ) 10 MG tablet TAKE 1 TABLET DAILY 01/13/13   Micky Reyes BIRCH, MD  aspirin 81 MG tablet Take 81 mg by mouth daily.      [provider]  atorvastatin  (LIPITOR) 80 MG tablet Take 1 tablet (80 mg total) by mouth daily. 02/06/15   Alvan Dorn FALCON, MD  furosemide  (LASIX ) 40 MG tablet Take 40 mg by mouth as needed.    [provider]  meclizine (ANTIVERT) 25 MG tablet Take 25 mg by mouth 3 (three) times daily as needed for dizziness.     [provider]  metoprolol  succinate (TOPROL  XL) 25 MG 24 hr tablet Take 0.5 tablets (12.5 mg total) by mouth daily. 11/04/12   Micky Reyes BIRCH, MD  nitroGLYCERIN  (NITROSTAT ) 0.4 MG SL tablet Place 1 tablet (0.4 mg total) under the tongue every 5 (five) minutes as needed. 11/22/13   Alvan Dorn FALCON, MD  vitamin B-12 (CYANOCOBALAMIN ) 1000 MCG tablet Take 1,000 mcg by mouth daily.    [provider]    Allergies: Patient has no known allergies.    Review of Systems  All other systems reviewed and are negative.   Updated Vital Signs BP 125/73   Pulse 64   Temp 97.9 F (36.6 C) (Oral)   Resp 18   Ht 6' 1 (1.854 m)   Wt 102 kg   SpO2 92%   BMI 29.67 kg/m   Physical Exam Vitals and nursing note reviewed.  Constitutional:      General: He is not in  acute distress.    Appearance: He is well-developed. He is not diaphoretic.  HENT:     Head: Normocephalic and atraumatic.     Mouth/Throat:     Mouth: Mucous membranes are moist.     Pharynx: No oropharyngeal exudate or posterior oropharyngeal erythema.  Cardiovascular:     Rate and Rhythm: Normal rate and regular rhythm.     Heart sounds: No murmur heard.    No friction rub.  Pulmonary:     Effort: Pulmonary effort is normal. No respiratory distress.     Breath sounds: Normal breath sounds. No wheezing or rales.  Abdominal:     General: Bowel sounds are normal. There is no distension.     Palpations: Abdomen is soft.     Tenderness: There is no abdominal tenderness.  Musculoskeletal:        General: Normal range of motion.     Cervical back: Normal range of motion and neck supple.  Skin:    General: Skin is warm and dry.  Neurological:     Mental Status: He is alert and oriented to person, place, and time.     Coordination: Coordination normal.     (all labs ordered are listed, but  only abnormal results are displayed) Labs Reviewed  BASIC METABOLIC PANEL WITH GFR  CBC WITH DIFFERENTIAL/PLATELET    EKG: None  Radiology: No results found.   Procedures   Medications Ordered in the ED  glucagon  (human recombinant) (GLUCAGEN ) injection 1 mg (has no administration in time range)  diazepam  (VALIUM ) injection 2.5 mg (has no administration in time range)                                    Medical Decision Making Amount and/or Complexity of Data Reviewed Labs: ordered.  Risk Prescription drug management.   Patient presenting with inability to swallow after getting a piece of steak stuck in his esophagus.  He arrives with stable vital signs and is afebrile.  Physical exam is basically unremarkable.  Laboratory studies obtained including CBC and BMP, both of which are unremarkable  Patient hydrated with NS, then given glucagon  and valium , followed by soda, but  with no relief.  He immediately vomited the soda back up.  Care discussed with Dr. Cinderella from Gastroenterology who evaluate and likely perform endoscopy in the AM.  Patient will remain in the ED until that time.     Final diagnoses:  None    ED Discharge Orders     None          Geroldine Berg, MD 12/07/23 0210

## 2023-12-07 NOTE — Consult Note (Signed)
 Consulting  Provider: Dr. Geroldine Primary Care Physician:  Burnard Elsie RAMAN, MD Primary Gastroenterologist: Previously unassigned  Reason for Consultation: Esophageal food impaction  HPI:  [name] is a 89 y.o. male with a past medical history of hypertension, dyslipidemia, CAD status post DES, who presented to [facility], ER with yesterday evening around midnight with complaints of esophageal food impaction.  Impaction apparently happened while eating steak 2 days ago at lunchtime.  Since then he has been unable to tolerate any food or liquids.  Tried eggs at 1 point which he immediately vomited.  Tried tomato soup yesterday as well which he immediately vomited.  Not able to tolerate any liquids as well.  Has a history of dysphagia, actually saw GI specialist November 2024.  Barium pill esophagram as well as MBSS were ordered though patient never followed through with this.  In the ER, patient was given glucagon  Valium  followed by soda with no relief.  Immediately vomited the soda back up per ER notes.  Patient does report some epigastric discomfort.  Constant, does not radiate. Moderate in severity.    Past Medical History:  Diagnosis Date   AAA (abdominal aortic aneurysm) (HCC)    Stent graft   CAD (coronary artery disease)    DES x4 RCA, DES LAD,  /    catheterization January, 2008, nonobstructive disease   Carotid bruit    Doppler, no significant stenosis   Colon polyps    Cough    December, 2011   Depression    Situational depression with loss of family members   Dyslipidemia    Elevated bilirubin    2.4,   2010  /  2.4,  2012,  chronic    Hyperlipidemia    Hypertension    Leg pain    Myocardial infarction (HCC) 2009    Past Surgical History:  Procedure Laterality Date   ABDOMINAL AORTIC ANEURYSM REPAIR  03/22/2007   ARGIOPLASTY     CHOLECYSTECTOMY  2005    Prior to Admission medications   Medication Sig Start Date End Date Taking? Authorizing Provider   amLODipine  (NORVASC ) 10 MG tablet TAKE 1 TABLET DAILY 01/13/13  Yes Micky Reyes BIRCH, MD  aspirin 81 MG tablet Take 81 mg by mouth daily.     Yes [provider]  atorvastatin  (LIPITOR) 80 MG tablet Take 1 tablet (80 mg total) by mouth daily. 02/06/15  Yes BranchDorn FALCON, MD  furosemide  (LASIX ) 40 MG tablet Take 40 mg by mouth as needed.   Yes [provider]  vitamin B-12 (CYANOCOBALAMIN ) 1000 MCG tablet Take 1,000 mcg by mouth daily.   Yes [provider]  meclizine (ANTIVERT) 25 MG tablet Take 25 mg by mouth 3 (three) times daily as needed for dizziness.     [provider]  metoprolol  succinate (TOPROL  XL) 25 MG 24 hr tablet Take 0.5 tablets (12.5 mg total) by mouth daily. 11/04/12   Micky Reyes BIRCH, MD  nitroGLYCERIN  (NITROSTAT ) 0.4 MG SL tablet Place 1 tablet (0.4 mg total) under the tongue every 5 (five) minutes as needed. 11/22/13   Alvan Dorn FALCON, MD    No current facility-administered medications for this encounter.    Allergies as of 12/06/2023   (No Known Allergies)    Family History  Problem Relation Age of Onset   Heart failure Mother    Coronary artery disease Mother    Heart disease Mother        before age 80   Hyperlipidemia  Mother    Hypertension Mother    Stroke Father    Hypertension Father    Coronary artery disease Sister    Hypertension Sister    Hyperlipidemia Sister     Social History   Socioeconomic History   Marital status: Widowed    Spouse name: Not on file   Number of children: Not on file   Years of education: Not on file   Highest education level: Not on file  Occupational History   Not on file  Tobacco Use   Smoking status: Former    Current packs/day: 0.00    Average packs/day: 0.8 packs/day for 48.2 years (38.5 ttl pk-yrs)    Types: Cigarettes    Start date: 03/27/1941    Quit date: 05/26/1989    Years since quitting: 34.5   Smokeless tobacco: Never  Substance and Sexual Activity   Alcohol  use: No    Alcohol/week: 0.0 standard drinks of alcohol   Drug use: No   Sexual activity: Not on file  Other Topics Concern   Not on file  Social History Narrative   Retired Printmaker   Social Drivers of Health   Financial Resource Strain: Low Risk  (08/20/2022)   Received from [facility]   Overall Financial Resource Strain (CARDIA)    Difficulty of Paying Living Expenses: Not very hard  Food Insecurity: No Food Insecurity (08/20/2022)   Received from [facility]   Hunger Vital Sign    Within the past 12 months, you worried that your food would run out before you got the money to buy more.: Never true    Within the past 12 months, the food you bought just didn't last and you didn't have money to get more.: Never true  Transportation Needs: No Transportation Needs (08/20/2022)   Received from [facility]    Lack of Transportation (Medical): No    Lack of Transportation (Non-Medical): No  Physical Activity: Not on file  Stress: Not on file  Social Connections: Not on file  Intimate Partner Violence: Not At Risk (11/01/2023)   Received from [facility]   HITS    Over the last 12 months how often did your partner physically hurt you?: Never    Over the last 12 months how often did your partner insult you or talk down to you?: Never    Over the last 12 months how often did your partner threaten you with physical harm?: Never    Over the last 12 months how often did your partner scream or curse at you?: Never    Review of Systems: General: Negative for anorexia, weight loss, fever, chills, fatigue, weakness. Eyes: Negative for vision changes.  ENT: Negative for hoarseness, difficulty swallowing , nasal congestion. CV: Negative for chest pain, angina, palpitations, dyspnea on exertion, peripheral edema.  Respiratory: Negative for dyspnea at rest, dyspnea on exertion, cough, sputum, wheezing.  GI: See history of present illness. GU:  Negative for  dysuria, hematuria, urinary incontinence, urinary frequency, nocturnal urination.  MS: Negative for joint pain, low back pain.  Derm: Negative for rash or itching.  Neuro: Negative for weakness, abnormal sensation, seizure, frequent headaches, memory loss, confusion.  Psych: Negative for anxiety, depression Endo: Negative for unusual weight change.  Heme: Negative for bruising or bleeding. Allergy: Negative for rash or hives.  Physical Exam: Vital signs in last 24 hours: Temp:  [97.6 F (36.4 C)-97.9 F (36.6 C)] 97.6 F (36.4 C) (07/14 0841) Pulse  Rate:  [56-71] 60 (07/14 0730) Resp:  [12-30] 28 (07/14 0730) BP: (113-157)/(62-90) 157/72 (07/14 0841) SpO2:  [88 %-93 %] 92 % (07/14 0730) Weight:  [102 kg] 102 kg (07/13 2352)   General:   Alert,  Well-developed, well-nourished, pleasant and cooperative in NAD Head:  Normocephalic and atraumatic. Eyes:  Sclera clear, no icterus.   Conjunctiva pink. Ears:  Normal auditory acuity. Nose:  No deformity, discharge,  or lesions. Mouth:  No deformity or lesions, dentition normal. Neck:  Supple; no masses or thyromegaly. Lungs:  Clear throughout to auscultation.   No wheezes, crackles, or rhonchi. No acute distress. Heart:  Regular rate and rhythm; no murmurs, clicks, rubs,  or gallops. Abdomen:  Soft, nontender and nondistended. No masses, hepatosplenomegaly or hernias noted. Normal bowel sounds, without guarding, and without rebound.   Msk:  Symmetrical without gross deformities. Normal posture. Pulses:  Normal pulses noted. Extremities:  Without clubbing or edema. Neurologic:  Alert and  oriented x4;  grossly normal neurologically. Skin:  Intact without significant lesions or rashes. Cervical Nodes:  No significant cervical adenopathy. Psych:  Alert and cooperative. Normal mood and affect.  Intake/Output from previous day: No intake/output data recorded. Intake/Output this shift: No intake/output data recorded.  Lab  Results: Recent Labs    12/07/23 0049  WBC 10.1  HGB 16.9  HCT 49.2  PLT 129*   BMET Recent Labs    12/07/23 0049  NA 133*  K 3.9  CL 101  CO2 21*  GLUCOSE 149*  BUN 21  CREATININE 1.03  CALCIUM  9.0   LFT No results for input(s): PROT, ALBUMIN, AST, ALT, ALKPHOS, BILITOT, BILIDIR, IBILI in the last 72 hours. PT/INR No results for input(s): LABPROT, INR in the last 72 hours. Hepatitis Panel No results for input(s): HEPBSAG, HCVAB, HEPAIGM, HEPBIGM in the last 72 hours. C-Diff No results for input(s): CDIFFTOX in the last 72 hours.  Studies/Results: No results found.  Assessment: *Esophageal food impaction *Epigastric pain  Plan: Will proceed with urgent upper endoscopy to remove food impaction. The risks including infection, bleed, or perforation and even death, as well as benefits, limitations, alternatives and imponderables have been reviewed with the patient. Potential for esophageal dilation, biopsy, etc. have also been reviewed.  Questions have been answered. All parties agreeable.   Carlin POUR. Cindie, D.O. Gastroenterology and Hepatology [facility]    LOS: 0 days     12/07/2023, 8:45 AM

## 2023-12-07 NOTE — Discharge Instructions (Addendum)
 EGD Discharge instructions Please read the instructions outlined below and refer to this sheet in the next few weeks. These discharge instructions provide you with general information on caring for yourself after you leave the hospital. Your doctor may also give you specific instructions. While your treatment has been planned according to the most current medical practices available, unavoidable complications occasionally occur. If you have any problems or questions after discharge, please call your doctor. ACTIVITY You may resume your regular activity but move at a slower pace for the next 24 hours.  Take frequent rest periods for the next 24 hours.  Walking will help expel (get rid of) the air and reduce the bloated feeling in your abdomen.  No driving for 24 hours (because of the anesthesia (medicine) used during the test).  You may shower.  Do not sign any important legal documents or operate any machinery for 24 hours (because of the anesthesia used during the test).  NUTRITION Drink plenty of fluids.  You may resume your normal diet.  Begin with a light meal and progress to your normal diet.  Avoid alcoholic beverages for 24 hours or as instructed by your caregiver.  MEDICATIONS You may resume your normal medications unless your caregiver tells you otherwise.  WHAT YOU CAN EXPECT TODAY You may experience abdominal discomfort such as a feeling of fullness or "gas" pains.  FOLLOW-UP Your doctor will discuss the results of your test with you.  SEEK IMMEDIATE MEDICAL ATTENTION IF ANY OF THE FOLLOWING OCCUR: Excessive nausea (feeling sick to your stomach) and/or vomiting.  Severe abdominal pain and distention (swelling).  Trouble swallowing.  Temperature over 101 F (37.8 C).  Rectal bleeding or vomiting of blood.   I successfully remove the food impaction today.  You have evidence of candidal esophagitis which is a yeast infection.  I am going to send in a 14-day course of nystatin   swish and swallow.  Take 5 mL 4 times a day and swallow contents.  I am also going to start you on pantoprazole  daily.  I sent both of these to your pharmacy.  You need to follow-up with gastroenterologist.  We would be happy for you to establish care with us  here in [place] or you can go back to your previously seen gastroenterologist.   I hope you have a great rest of your week!  Carlin POUR. Cindie, D.O. Gastroenterology and Hepatology [facility]

## 2023-12-07 NOTE — Transfer of Care (Signed)
 Immediate Anesthesia Transfer of Care Note  Patient: [name]  Procedure(s) Performed: EGD (ESOPHAGOGASTRODUODENOSCOPY)  Patient Location: Short Stay  Anesthesia Type:General  Level of Consciousness: awake and patient cooperative  Airway & Oxygen Therapy: Patient Spontanous Breathing  Post-op Assessment: Report given to RN and Post -op Vital signs reviewed and stable  Post vital signs: Reviewed and stable  Last Vitals:  Vitals Value Taken Time  BP 123/69 12/07/23 09:58  Temp    Pulse 61 12/07/23 09:58  Resp 20 12/07/23 09:58  SpO2 99% on 2L [place] 12/07/23 09:58    Last Pain:  Vitals:   12/07/23 0919  TempSrc:   PainSc: 0-No pain         Complications: No notable events documented.

## 2023-12-08 ENCOUNTER — Encounter (HOSPITAL_COMMUNITY): Payer: Self-pay | Admitting: Internal Medicine

## 2023-12-09 ENCOUNTER — Telehealth: Payer: Self-pay | Admitting: Internal Medicine

## 2023-12-25 NOTE — Telephone Encounter (Signed)
 Patient needs fu visit with me in office. Thank you

## 2023-12-31 NOTE — Telephone Encounter (Signed)
 Made several attempts to contact patient to schedule an appointment. No response. Letter mailed.

## 2024-01-20 ENCOUNTER — Encounter: Payer: Self-pay | Admitting: Internal Medicine

## 2024-01-20 ENCOUNTER — Ambulatory Visit (INDEPENDENT_AMBULATORY_CARE_PROVIDER_SITE_OTHER): Admitting: Internal Medicine

## 2024-01-20 VITALS — BP 144/83 | HR 61 | Temp 97.5°F | Ht 73.0 in | Wt 224.5 lb

## 2024-01-20 DIAGNOSIS — B3781 Candidal esophagitis: Secondary | ICD-10-CM

## 2024-01-20 DIAGNOSIS — R1319 Other dysphagia: Secondary | ICD-10-CM

## 2024-01-20 DIAGNOSIS — K219 Gastro-esophageal reflux disease without esophagitis: Secondary | ICD-10-CM

## 2024-01-20 NOTE — Progress Notes (Signed)
 Referring Provider: Burnard Elsie RAMAN, MD Primary Care Physician:  Burnard Elsie RAMAN, MD Primary GI:  Dr. Cindie  Chief Complaint  Patient presents with   New Patient (Initial Visit)    Patient here today as a new patient to follow up on a recent ed to admission on 12/06/2023 due to a food impaction. Patient says he still has issues with swallowing especially when he eats bread. On pantoprazole  40 mg once per day.     HPI:   [name] is a 89 y.o. male who presents to the clinic today for procedure follow-up visit.  Presented to [facility], ER 12/07/2023 with esophageal food impaction.  EGD with food bolus in the mid esophagus, diffuse white plaques concerning for possible candidal esophagitis, normal stomach, normal duodenum.  Completed course of nystatin  swish and swallow (fluconazole not used due to QTc of 478).  Also started on pantoprazole .  Today states his symptoms are much improved.  Very rarely coughs while eating, only rarely has issues with cornbread.  No chronic NSAID use.  No melena hematochezia.  Past Medical History:  Diagnosis Date   AAA (abdominal aortic aneurysm) (HCC)    Stent graft   CAD (coronary artery disease)    DES x4 RCA, DES LAD,  /    catheterization January, 2008, nonobstructive disease   Carotid bruit    Doppler, no significant stenosis   Colon polyps    Cough    December, 2011   Depression    Situational depression with loss of family members   Dyslipidemia    Elevated bilirubin    2.4,   2010  /  2.4,  2012,  chronic    Hyperlipidemia    Hypertension    Leg pain    Myocardial infarction [facility]) 2009    Past Surgical History:  Procedure Laterality Date   ABDOMINAL AORTIC ANEURYSM REPAIR  03/22/2007   ARGIOPLASTY     CHOLECYSTECTOMY  2005   ESOPHAGOGASTRODUODENOSCOPY N/A 12/07/2023   Procedure: EGD (ESOPHAGOGASTRODUODENOSCOPY);  Surgeon: Cindie Carlin POUR, DO;  Location: [facility];  Service: Endoscopy;  Laterality: N/A;     Current Outpatient Medications  Medication Sig Dispense Refill   amLODipine  (NORVASC ) 10 MG tablet TAKE 1 TABLET DAILY 30 tablet 6   aspirin 81 MG tablet Take 81 mg by mouth daily.       atorvastatin  (LIPITOR) 80 MG tablet Take 1 tablet (80 mg total) by mouth daily. 90 tablet 3   furosemide  (LASIX ) 40 MG tablet Take 40 mg by mouth as needed.     gabapentin (NEURONTIN) 100 MG capsule Take 100 mg by mouth at bedtime.     meclizine (ANTIVERT) 25 MG tablet Take 25 mg by mouth 3 (three) times daily as needed for dizziness.      metoprolol  succinate (TOPROL  XL) 25 MG 24 hr tablet Take 0.5 tablets (12.5 mg total) by mouth daily. 15 tablet 1   nitroGLYCERIN  (NITROSTAT ) 0.4 MG SL tablet Place 1 tablet (0.4 mg total) under the tongue every 5 (five) minutes as needed. 25 tablet 3   pantoprazole  (PROTONIX ) 40 MG tablet Take 1 tablet (40 mg total) by mouth daily. 30 tablet 11   vitamin B-12 (CYANOCOBALAMIN ) 1000 MCG tablet Take 1,000 mcg by mouth daily.     No current facility-administered medications for this visit.    Allergies as of 01/20/2024   (No Known Allergies)    Family History  Problem Relation Age of Onset   Heart failure  Mother    Coronary artery disease Mother    Heart disease Mother        before age 43   Hyperlipidemia Mother    Hypertension Mother    Stroke Father    Hypertension Father    Coronary artery disease Sister    Hypertension Sister    Hyperlipidemia Sister     Social History   Socioeconomic History   Marital status: Widowed    Spouse name: Not on file   Number of children: Not on file   Years of education: Not on file   Highest education level: Not on file  Occupational History   Not on file  Tobacco Use   Smoking status: Former    Current packs/day: 0.00    Average packs/day: 0.8 packs/day for 48.2 years (38.5 ttl pk-yrs)    Types: Cigarettes    Start date: 03/27/1941    Quit date: 05/26/1989    Years since quitting: 34.6   Smokeless tobacco:  Never  Vaping Use   Vaping status: Never Used  Substance and Sexual Activity   Alcohol use: No    Alcohol/week: 0.0 standard drinks of alcohol   Drug use: No   Sexual activity: Not on file  Other Topics Concern   Not on file  Social History Narrative   Retired Printmaker   Social Drivers of Corporate investment banker Strain: Low Risk  (08/20/2022)   Received from [facility]   Overall Financial Resource Strain (CARDIA)    Difficulty of Paying Living Expenses: Not very hard  Food Insecurity: No Food Insecurity (08/20/2022)   Received from [facility]   Hunger Vital Sign    Within the past 12 months, you worried that your food would run out before you got the money to buy more.: Never true    Within the past 12 months, the food you bought just didn't last and you didn't have money to get more.: Never true  Transportation Needs: No Transportation Needs (08/20/2022)   Received from [facility] - Transportation    Lack of Transportation (Medical): No    Lack of Transportation (Non-Medical): No  Physical Activity: Not on file  Stress: Not on file  Social Connections: Not on file    Subjective: Review of Systems  Constitutional:  Negative for chills and fever.  HENT:  Negative for congestion and hearing loss.   Eyes:  Negative for blurred vision and double vision.  Respiratory:  Negative for cough and shortness of breath.   Cardiovascular:  Negative for chest pain and palpitations.  Gastrointestinal:  Negative for abdominal pain, blood in stool, constipation, diarrhea, heartburn, melena and vomiting.  Genitourinary:  Negative for dysuria and urgency.  Musculoskeletal:  Negative for joint pain and myalgias.  Skin:  Negative for itching and rash.  Neurological:  Negative for dizziness and headaches.  Psychiatric/Behavioral:  Negative for depression. The patient is not nervous/anxious.      Objective: BP (!) 144/83 (BP Location: Left Arm, Patient Position:  Sitting, Cuff Size: Normal)   Pulse 61   Temp (!) 97.5 F (36.4 C) (Temporal)   Ht 6' 1 (1.854 m)   Wt 224 lb 8 oz (101.8 kg)   BMI 29.62 kg/m  Physical Exam Constitutional:      Appearance: Normal appearance.  HENT:     Head: Normocephalic and atraumatic.  Eyes:     Extraocular Movements: Extraocular movements intact.     Conjunctiva/sclera: Conjunctivae normal.  Cardiovascular:  Rate and Rhythm: Normal rate and regular rhythm.  Pulmonary:     Effort: Pulmonary effort is normal.     Breath sounds: Normal breath sounds.  Abdominal:     General: Bowel sounds are normal.     Palpations: Abdomen is soft.  Musculoskeletal:        General: Normal range of motion.     Cervical back: Normal range of motion and neck supple.  Skin:    General: Skin is warm.  Neurological:     General: No focal deficit present.     Mental Status: He is alert and oriented to person, place, and time.  Psychiatric:        Mood and Affect: Mood normal.        Behavior: Behavior normal.      Assessment: *Chronic GERD *Esophageal dysphagia *Candidal esophagitis  Plan: Overall, patient states his symptoms are improved on PPI therapy as well as completion of treatment for his candidal esophagitis.  Discussed repeat EGD with him and his family today.  Given his age and comorbidities as well as improvement in symptoms, he would like to continue to monitor for now.  Continue pantoprazole  40 mg daily.  Patient to avoid tough textures. All meats should be chopped finely. Eat slowly, take small bites, chew thoroughly, and drink plenty of liquids throughout meals. Advised if something were to get hung in patient's esophagus and not come up or go down, they should proceed to the emergency room.  Follow-up in 3-4  01/20/2024 11:35 AM   Disclaimer: This note was dictated with voice recognition software. Similar sounding words can inadvertently be transcribed and may not be corrected upon review.

## 2024-01-20 NOTE — Patient Instructions (Signed)
 I am happy to hear that you are doing better.  Continue on pantoprazole  daily.  Will continue to monitor your symptoms.   Recommend you avoid tough textures. All meats should be chopped finely. Eat slowly, take small bites, chew thoroughly, and drink plenty of liquids throughout meals. If something were to get hung in your esophagus and not come up or go down, you should proceed to the emergency room.  Follow up in 3-4 months  Dr. Cindie

## 2024-03-15 ENCOUNTER — Encounter: Payer: Self-pay | Admitting: Internal Medicine
